# Patient Record
Sex: Male | Born: 1985 | Race: Black or African American | Hispanic: No | Marital: Single
Health system: Midwestern US, Community
[De-identification: ages and names within clinical notes are randomized; demographics above are authoritative.]

## PROBLEM LIST (undated history)

## (undated) DIAGNOSIS — I10 Essential (primary) hypertension: Secondary | ICD-10-CM

---

## 2008-10-20 ENCOUNTER — Observation Stay (HOSPITAL_COMMUNITY): Admission: EM | Admit: 2008-10-20 | Discharge: 2008-10-21 | Payer: Self-pay | Admitting: Emergency Medicine

## 2008-10-29 ENCOUNTER — Inpatient Hospital Stay (HOSPITAL_COMMUNITY): Admission: RE | Admit: 2008-10-29 | Discharge: 2008-10-31 | Payer: Self-pay | Admitting: Orthopedic Surgery

## 2009-06-24 ENCOUNTER — Ambulatory Visit (HOSPITAL_COMMUNITY): Admission: RE | Admit: 2009-06-24 | Discharge: 2009-06-25 | Payer: Self-pay | Admitting: Orthopedic Surgery

## 2010-10-11 LAB — CBC
Hemoglobin: 14.5 g/dL (ref 13.0–17.0)
MCHC: 34.3 g/dL (ref 30.0–36.0)
MCV: 91.5 fL (ref 78.0–100.0)
RBC: 4.61 MIL/uL (ref 4.22–5.81)

## 2010-10-19 LAB — BASIC METABOLIC PANEL
CO2: 27 mEq/L (ref 19–32)
Calcium: 10.1 mg/dL (ref 8.4–10.5)
Creatinine, Ser: 1.09 mg/dL (ref 0.4–1.5)
Glucose, Bld: 79 mg/dL (ref 70–99)

## 2010-10-19 LAB — CBC
MCHC: 34.4 g/dL (ref 30.0–36.0)
RDW: 13.8 % (ref 11.5–15.5)

## 2010-10-19 LAB — POCT I-STAT, CHEM 8
Glucose, Bld: 92 mg/dL (ref 70–99)
HCT: 48 % (ref 39.0–52.0)
Hemoglobin: 16.3 g/dL (ref 13.0–17.0)
Potassium: 3.4 mEq/L — ABNORMAL LOW (ref 3.5–5.1)
Sodium: 136 mEq/L (ref 135–145)

## 2010-11-22 NOTE — Consult Note (Signed)
NAMELENIS, NETTLETON NO.:  [id number]   MEDICAL RECORD NO.:  [id number]          PATIENT TYPE:  EMS   LOCATION:  ED                           FACILITY:  [facility]   PHYSICIAN:  Alvy Beal, MD    DATE OF BIRTH:  Mar 31, 1986   DATE OF CONSULTATION:  10/20/2008  DATE OF DISCHARGE:                                 CONSULTATION   REASON FOR CONSULTATION:  Bilateral upper and the elbow injury.   HISTORY:  This is a very pleasant 25 year old otherwise healthy African  American male who was playing basketball earlier today when he fell and  landed on both elbows.  He noted immediate pain and inability to move  the elbow.  He was brought by EMS to the emergency room here at [facility] where x-rays and initial evaluation was done.  X-  rays demonstrated a posterior right elbow dislocation and a left distal  supracondylar fracture as well as a questionable small avulsion-type  fracture from the posterior olecranon on the right side.  As a result of  the left supracondylar fracture and the right fracture dislocation of  the elbow, orthopedic consultation was requested.  Upon arrival the  patient was alert and oriented x3 complaining only of elbow pain.   PAST MEDICAL HISTORY:   PAST SURGICAL HISTORY:   FAMILY HISTORY:   SOCIAL HISTORY:  Otherwise unremarkable.  He has had a previous left  shoulder arthroscopy, but no other significant medical problems.   ALLERGIES:  No known drug allergies.   SOCIAL HISTORY:  He denies tobacco, illicit drug use and alcohol use.   CLINICAL EXAMINATION:  GENERAL:  He is a pleasant gentleman who appears  his stated age in no acute distress.  He is alert and oriented x3.  No  shortness of breath or chest pain at present.  ABDOMEN:  Soft and nontender.  EXTREMITIES:  Full range of motion in the lower extremities bilaterally  with no hip, knee or ankle pain.  Intact peripheral pulses at the radial  artery  bilaterally.  Posterior interosseous nerve, median nerve, ulnar  nerve function is intact bilaterally.  He has no significant shoulder  pain.  He has significant swelling and deformity of both elbows.   X-rays were reviewed and they do demonstrate a supracondylar fracture of  the left elbow.  It does not appear to involve the joint.  The right  elbow has a posterior dislocation of the ulna of the elbow.  There is a  small avulsion off the tip of the olecranon as well.   At this point in time after providing informed consent, the patient was  consciously sedated and under sterile condition, an intra-articular  lidocaine block was given into the right elbow.  This was 10 mL of plain  lidocaine.  A gentle reduction maneuver was performed and the elbow was  easily reduced.  It had excellent range of motion post reduction and  there was no gross instability that I could determine.  At this point  with the right elbow reduced, a posterior splint was applied  and then  two side struts were applied for augmenting of the string of the splint.  Once this elbow was taken care of on the left side, I gently flexed the  elbow up to about 90 degrees and placed a posterior splint with again  side struts.   Once both splints were applied the patient was still able to move all of  his fingers.  Median, ulnar and posterior interosseous nerve function  was again tested and was intact and the pulses were still strong.  Capillary refill was less than 2 seconds.  At this point in time the  patient will be admitted for observation and CT scanning of both elbows.  I did have the opportunity to discuss this case with my partner, Dr.  Melvyn Novas IV and he will follow up the patient after discharge for  definitive fracture management.  At this point in time, we will obtain  the CT scans to further delineate the extent of the fracture and both  elbows.  Post reduction x-rays were taken in the ER post splint   application.  They are still pending at this time.   The patient will be admitted with a PCA for pain control and Robaxin for  muscle spasms and observation and icing.  As long as he remains  neurologically intact, then we will discharge him tomorrow.      Alvy Beal, MD  Electronically Signed     DDB/MEDQ  D:  10/20/2008  T:  10/20/2008  Job:  [id number]   cc:   [facility]

## 2010-11-22 NOTE — Op Note (Signed)
NAME:  Jonathan [name], Jonathan [name] NO.:  [id number]   MEDICAL RECORD NO.:  [id number]          PATIENT TYPE:  OIB   LOCATION:  5005                         FACILITY:  [facility]   PHYSICIAN:  Madelynn Done, MD  DATE OF BIRTH:  1986-05-20   DATE OF PROCEDURE:  DATE OF DISCHARGE:                               OPERATIVE REPORT   PREOPERATIVE DIAGNOSIS:  Left elbow intraarticular distal humerus  fracture.   POSTOPERATIVE DIAGNOSIS:  Left elbow intraarticular distal humerus  fracture.   ATTENDING SURGEON:  Madelynn Done, MD, who scrubbed and present for  the entire procedure.   ASSISTANT SURGEON:  Alvy Beal, MD, who scrubbed and present for  the key portions of the procedure, who helped to assist in fracture  reduction and application of the plates and tension band fixation.   SURGICAL PROCEDURES:  1. Open treatment of left elbow distal humerus intraarticular fracture      with internal fixation.  2. Left elbow ulnar nerve decompression and anterior transposition.  3. Stress radiography, left elbow.   SURGICAL IMPLANTS:  Acumed medial and lateral and distal humerus plates,  six-hole plate on the lateral side, eight-hole plate on the medial side  with one standard Acutrak screw and one 4.5 mm screw outside of the  plates.   SURGICAL INDICATIONS:  Jonathan [name] is a 25 year old right-hand-dominant  gentleman who sustained a closed injury to both elbows sustaining a  comminuted intraarticular fracture to the left elbow as well as a closed  elbow dislocation on the contralateral side.  Due to the intraarticular  nature and displacement of the fracture, it was recommended that he  undergo open reduction and internal fixation of his left elbow.  Risks,  benefits, and alternatives were discussed in detail with the patient and  signed informed consent was obtained.  Risks include, but not limited to  bleeding; infection; damage to nearby nerves, arteries, or tendons;  nonunion; malunion; heterotopic bone ossification; loss of motion to the  elbow; and need for further surgical intervention.   DESCRIPTION OF PROCEDURE:  The patient was properly identified in the  preoperative holding area and marked with a permanent marker made on the  left elbow to indicate correct operative site.  The patient was then  brought back to the operating room, placed supine on the anesthesia room  table where general anesthesia was administered via LMA.  The patient  tolerated this well.  A well-padded tourniquet was then placed on the  left brachium and sealed with a 1000 drape.  The left upper extremity  was then prepped with Hibiclens and then sterilely draped.  Prior to the  prepping and draping, the patient was placed in the lateral decubitus  position and all pressure points were well padded.  Axillary roll was  used.  A Foley catheter was inserted prior to the beginning of the case  sterilely.  The patient tolerated this well.  All pressure points were  well padded.  After the prepping and the drape, attention was then  turned to the posterior aspect of the elbow.  A  longitudinal incision  was then made curving slightly the radial over the posterior surface of  the olecranon, after which a sterile tourniquet was then applied.  This  tourniquet was insufflated to 250 mmHg.  The skin flaps were raised both  radially and ulnarly.  Attention was then turned ulnarly.  Radioulnar  nerve was then dissected and identified both proximally and distally.  An ulnar nerve decompression was then carried out releasing all points  of compression.  A small vessel loop was applied around the nerve and  protected throughout the entire case.  After the ulnar nerve was then  identified, then an osteotomy was then created using a small oscillating  saw completed with an osteotome in the center of the olecranon sulcus.  This was exposed by elevating the anconeus and placing a small sponge   from lateral to medial to use its counter traction for the osteotomy.  After the osteotomy was then created, the olecranon fragment was then  elevated proximally leaving margins of the triceps tendon on either side  to sew with the completion of surgery.  The fracture anatomy was then  confirmed and the articular fragments were then reduced on the bony  columns of the distal humerus.  Fracture hematoma was then evacuated.  Following this, the joint was then thoroughly irrigated and fracture  reduction was then obtained.  A large reduction clamp was then used in  order to reduce the fracture fragments.  The articular fragments were  then reduced and then a K-wire was then placed from medial to lateral  and then using a parallel drill, the appropriate drill was then used for  the 4.5 mm screw going across the condyles with good compression across  the condyles.  Once the articular sterile structure was then reduced and  the condyles reduced, attention was then turned to definitive fracture  fixation along the medial and lateral columns.  The Acumed medial column  plate was then fashioned, an eight-hole plate was then fixed proximally  and site was then adjusted appropriately.  Following this, a screw  fixation was then begun with the appropriate drill and depth gauge  measurements with the cortices above the fracture site and 3 more distal  screws.   Following the medial column fixation, attention was then turned to the  posterior and lateral column.  The patient did have a large posterior  cortical fragment and this was reduced in place.  The fracture fragment  was gone in the coronal plane and therefore a screw was then placed from  the posterior-to-anterior standard Acutrak screw reducing this fracture  fragment.  The lateral column plate and six-hole plate was then  fashioned and then two screws were then placed distally and two screws  were then placed proximally.  There was not  significant comminution in  the lateral column for it was in continuity.  Following lateral column  fixation, the wounds were then thoroughly irrigated.  Final C-arm images  were then obtained.  There was good fixation of both planes of the  distal humerus fracture.  Following this, the elbow was placed through a  range of motion and there was good stability following fixation.  Attention was then turned to the olecranon osteotomy.  This was then  reduced under direct visualization.  Using the Acumed tension band, two  K-wires were then placed parallel down the shaft of the intramedullary  canal.  A small transverse drill hole was then made proximally and an 18-  gauge wire was then fashioned.  Through the eyelets of the K-wires, the  suture was then tied in a figure-of-eight fashion with two knots  tensioning the wires.  The eyelets were then tamped down to the bone and  then broken off creating the tension construct.  Following this, the  wounds were then thoroughly irrigated.  The elbow was then placed  through a stress radiography confirming placement and application of the  implants.  There was good maintenance of both the medial and lateral  columns with good fixation of both the osteotomy and the distal humerus  fracture.  Following this, the triceps fascia both medially and  laterally was then closed with 0 Vicryl suture, figure-of-eight sutures  that were reinforced.  Attention was then turned to the ulnar nerve  transposition.  There was good coverage of the plate medially and the  nerve was then transposed anteriorly in a small pocket.  Subcutaneous  pocket was then created in order to mobilize the nerve anteriorly to  prevent posterior subluxation and avoidance of the implants.  The ulnar  nerve was transposed without any significant compression.  Following  this, the wounds were then thoroughly irrigated.  The subcutaneous  tissues were then closed with 0 Vicryl.  The skin was  then closed with  skin staples.  The wound was then thoroughly cleansed with 20 mL of  0.25% Marcaine and infiltrated posteriorly avoiding the medial side.  Xeroform dressing was then applied.  The patient was then placed in a  long-arm posterior splint.  He was extubated and taken to recovery room  in good condition.   POSTOPERATIVE PLAN:  The patient will be admitted overnight for IV  antibiotics and pain control.  He will begin activities, active  inpatient assisted flexion and extension exercises for the first 3-4  weeks.  Radiographs with 10-day mark and 3-week mark and continue to  monitor his progress.  At 6 weeks postoperatively, if the radiographic  union look good, the patient may progress to light resisted activities.      Madelynn Done, MD  Electronically Signed     FWO/MEDQ  D:  10/28/2008  T:  10/29/2008  Job:  [id number]

## 2010-11-25 NOTE — Discharge Summary (Signed)
[name], [name] NO.:  [id number]   MEDICAL RECORD NO.:  [id number]          PATIENT TYPE:  INP   LOCATION:  5005                         FACILITY:  [facility]   PHYSICIAN:  Madelynn Done, MD  DATE OF BIRTH:  20-Dec-1985   DATE OF ADMISSION:  10/28/2008  DATE OF DISCHARGE:  10/31/2008                               DISCHARGE SUMMARY   ADMISSION DIAGNOSES:  1. Left elbow distal humerus fracture.  2. Right elbow dislocation.   DISCHARGE DIAGNOSES:  1. Left elbow distal humerus fracture.  2. Right elbow dislocation.   CONDITION AT DISCHARGE:  Good.   SURGICAL PROCEDURES:  Open reduction and internal fixation of left elbow  distal humerus fracture on October 28, 2008.   DISCHARGE MEDICATIONS:  1. Percocet 5/500 one to two tablets every 4-6 hours as needed for      pain.  2. Robaxin 500 mg p.o. q.6 h. p.r.n. spasm.  3. Colace 100 mg p.o. b.i.d.   REASON FOR ADMISSION:  [name] is a 25 year old gentleman who  sustained bilateral elbow injuries from a fall playing basketball.  The  patient was admitted to undergo the surgery on his left elbow.  The  patient was admitted postoperatively following the extent to surgery.   HOSPITAL COURSE:  The patient was admitted to the orthopedic floor  following surgery.  The patient tolerated general anesthesia.  He was  continued on IV pain medication and IV antibiotics.  Occupational  therapy was consulted postoperatively because the patient did have  limited use of both upper extremities with his right arm being in a cast  and his left arm having been operated arm.  He needed assistance with  all ADLs because he did not have both of his elbows to use.  Postoperatively, his hospital course was somewhat prolonged because of  pain control and activity modification and getting him back to his  functional activity level.  Home health and case management were  consulted for evaluation.  The patient was seen throughout his  hospital  course and he was felt to be ready to be discharged on postoperative day  #3.  On the day of discharge, he was afebrile.  Vital signs were stable  and normal.  He was tolerating a regular diet and felt to be ready for  discharge to home.   RECOMMENDATIONS AND DISPOSITION:  The patient will be discharged to  home.  We will see him back in the office in approximately 1 week for  radiographs and evaluation, and then begin an outpatient therapy  program.  We will continue with the cast on both upper extremities.  He is to keep  his bandages clean and dry.  No lifting or use with both hands.  Prior  to his discharge, the patient's discharge instructions were explained to  him.  The questions were answered and the patient voiced understanding  of discharge instructions.      Madelynn Done, MD  Electronically Signed     FWO/MEDQ  D:  11/05/2008  T:  11/05/2008  Job:  [id number]

## 2011-02-02 ENCOUNTER — Emergency Department (HOSPITAL_COMMUNITY)
Admission: EM | Admit: 2011-02-02 | Discharge: 2011-02-02 | Disposition: A | Discharge status: Home / Self Care | Payer: Self-pay | Attending: Emergency Medicine | Admitting: Emergency Medicine

## 2011-02-02 DIAGNOSIS — K089 Disorder of teeth and supporting structures, unspecified: Secondary | ICD-10-CM | POA: Insufficient documentation

## 2011-02-02 DIAGNOSIS — R51 Headache: Secondary | ICD-10-CM | POA: Insufficient documentation

## 2012-08-15 ENCOUNTER — Encounter (HOSPITAL_COMMUNITY): Payer: Self-pay | Admitting: *Deleted

## 2012-08-15 ENCOUNTER — Emergency Department (HOSPITAL_COMMUNITY): Payer: PRIVATE HEALTH INSURANCE

## 2012-08-15 ENCOUNTER — Emergency Department (HOSPITAL_COMMUNITY)
Admission: EM | Admit: 2012-08-15 | Discharge: 2012-08-16 | Disposition: A | Discharge status: Home / Self Care | Payer: PRIVATE HEALTH INSURANCE | Attending: Emergency Medicine | Admitting: Emergency Medicine

## 2012-08-15 DIAGNOSIS — S62609A Fracture of unspecified phalanx of unspecified finger, initial encounter for closed fracture: Secondary | ICD-10-CM | POA: Insufficient documentation

## 2012-08-15 DIAGNOSIS — F17200 Nicotine dependence, unspecified, uncomplicated: Secondary | ICD-10-CM | POA: Insufficient documentation

## 2012-08-15 DIAGNOSIS — I10 Essential (primary) hypertension: Secondary | ICD-10-CM | POA: Insufficient documentation

## 2012-08-15 DIAGNOSIS — IMO0001 Reserved for inherently not codable concepts without codable children: Secondary | ICD-10-CM

## 2012-08-15 DIAGNOSIS — Y929 Unspecified place or not applicable: Secondary | ICD-10-CM | POA: Insufficient documentation

## 2012-08-15 DIAGNOSIS — X58XXXA Exposure to other specified factors, initial encounter: Secondary | ICD-10-CM | POA: Insufficient documentation

## 2012-08-15 DIAGNOSIS — Y939 Activity, unspecified: Secondary | ICD-10-CM | POA: Insufficient documentation

## 2012-08-15 DIAGNOSIS — S61209A Unspecified open wound of unspecified finger without damage to nail, initial encounter: Secondary | ICD-10-CM | POA: Insufficient documentation

## 2012-08-15 HISTORY — DX: Essential (primary) hypertension: I10

## 2012-08-15 NOTE — ED Notes (Signed)
Injury is to left hand

## 2012-08-15 NOTE — ED Provider Notes (Signed)
History   This chart was scribed for non-physician practitioner working with Jones Skene, MD by Gerlean Ren, ED Scribe. This patient was seen in room WTR9/WTR9 and the patient's care was started at 10:58 PM.   CSN: [id number]  Arrival date & time 08/15/12  2211   First MD Initiated Contact with Patient 08/15/12 2258      No chief complaint on file.    The history is provided by the patient. No language interpreter was used.  Jonathan [name] is a 27 y.o. male who presents to the Emergency Department complaining of multiple painful lacerations on left hand after punching through a glass window.  Pt denies any further injuries as a result.  Pt is unsure of last tetanus shot.    Past Medical History  Diagnosis Date  . Hypertension     History reviewed. No pertinent past surgical history.  No family history on file.  History  Substance Use Topics  . Smoking status: Current Every Day Smoker    Types: Cigarettes  . Smokeless tobacco: Not on file  . Alcohol Use: Yes      Review of Systems A complete 10 system review of systems was obtained and all systems are negative except as noted in the HPI and PMH.   Allergies  Review of patient's allergies indicates no known allergies.  Home Medications   Current Outpatient Rx  Name  Route  Sig  Dispense  Refill  . ADULT MULTIVITAMIN W/MINERALS CH   Oral   Take 1 tablet by mouth daily.           BP 142/89  Pulse 89  Temp 99.1 F (37.3 C)  Resp 20  SpO2 98%  Physical Exam  Nursing note and vitals reviewed. Constitutional: He is oriented to person, place, and time. He appears well-developed and well-nourished. No distress.  HENT:  Head: Normocephalic and atraumatic.  Eyes: EOM are normal.  Neck: Neck supple. No tracheal deviation present.  Cardiovascular: Normal rate.   Pulmonary/Chest: Effort normal. No respiratory distress.  Musculoskeletal: Normal range of motion.       Superficial lacerations between all four  finger, small avulsion on ulnar side of the base of the thumb, swelling over knuckle of third digit, good cap refill  Neurological: He is alert and oriented to person, place, and time.  Skin: Skin is warm and dry.  Psychiatric: He has a normal mood and affect. His behavior is normal.    ED Course  Procedures (including critical care time) DIAGNOSTIC STUDIES: Oxygen Saturation is 98% on room air, normal by my interpretation.    COORDINATION OF CARE: 11:00 PM- Informed pt that I will clean to wound and apply a new wrap around the hand.  Informed pt of hand XR.  Pt understands and agrees with plan.   Dg Hand Complete Left  08/15/2012  *RADIOLOGY REPORT*  Clinical Data: Patient punched a window of the left hand today and now has lacerations and pain.  LEFT HAND - COMPLETE 3+ VIEW  Comparison: None.  Findings: There is volar angulation of the distal fifth metacarpal bone.  No discrete fracture line is identified in this may be due to old fracture deformity.  Occult fracture is not entirely excluded.  The soft tissue swelling over the metacarpal heads. There is a mildly displaced fracture of the base of the proximal phalanx of the first finger with extension to the joint surface. Prominent overlying soft tissue swelling.  No radiopaque soft tissue foreign bodies.  IMPRESSION: Mildly displaced gamekeepers type fracture of the left first finger.  Soft tissue swelling.  Volar angulation of the fifth metacarpal bone without obvious acute fracture may represent old fracture deformity.  Soft tissue swelling is present however.   Original Report Authenticated By: Burman Nieves, M.D.    The wound was an avulsion type injury and was bleeding. Applied Combat Gauze to the area. The patient is advised of the need to follow up with ortho. Told to return here as needed. Splint applied.   MDM  I personally performed the services described in this documentation, which was scribed in my presence. The recorded  information has been reviewed and is accurate.        Carlyle Dolly, PA-C 08/18/12 [phone]

## 2012-08-15 NOTE — ED Notes (Signed)
Pt states punched glass window; presents with swelling to knuckles and abrasion/cut to base of thumb; pressure dressing applied with bleeding controlled; small laceration noted between small finger and ring finger; etoh

## 2012-08-16 MED ORDER — HYDROCODONE-ACETAMINOPHEN 5-325 MG PO TABS: 1.0000 | ORAL_TABLET | Freq: Four times a day (QID) | ORAL | Status: DC | PRN

## 2012-08-16 MED ORDER — HYDROCODONE-ACETAMINOPHEN 5-325 MG PO TABS
1.0000 | ORAL_TABLET | Freq: Once | ORAL | Status: AC
  Administered 2012-08-16: 1 via ORAL
  Filled 2012-08-16: qty 1

## 2012-08-16 NOTE — Progress Notes (Signed)
Orthopedic Tech Progress Note Patient Details:  Jonathan [name] [birth year] [id number]  Ortho Devices Type of Ortho Device: Ulna gutter splint   [name] 08/16/2012, 12:43 AM

## 2012-08-16 NOTE — ED Notes (Signed)
Ortho tech paged for orders, will arrive from [facility] as soon as available

## 2012-08-24 NOTE — ED Provider Notes (Signed)
Medical screening examination/treatment/procedure(s) were performed by non-physician practitioner and as supervising physician I was immediately available for consultation/collaboration.  John-Adam Maudry Zeidan, M.D.     John-Adam Jaice Digioia, MD 08/24/12 0736 

## 2013-08-29 ENCOUNTER — Encounter (HOSPITAL_COMMUNITY): Payer: Self-pay | Admitting: Emergency Medicine

## 2013-08-29 ENCOUNTER — Emergency Department (HOSPITAL_COMMUNITY): Payer: BC Managed Care – PPO

## 2013-08-29 ENCOUNTER — Emergency Department (HOSPITAL_COMMUNITY)
Admission: EM | Admit: 2013-08-29 | Discharge: 2013-08-29 | Disposition: A | Discharge status: Home / Self Care | Payer: BC Managed Care – PPO | Attending: Emergency Medicine | Admitting: Emergency Medicine

## 2013-08-29 DIAGNOSIS — I10 Essential (primary) hypertension: Secondary | ICD-10-CM | POA: Insufficient documentation

## 2013-08-29 DIAGNOSIS — Z79899 Other long term (current) drug therapy: Secondary | ICD-10-CM | POA: Insufficient documentation

## 2013-08-29 DIAGNOSIS — F430 Acute stress reaction: Secondary | ICD-10-CM | POA: Insufficient documentation

## 2013-08-29 DIAGNOSIS — R1011 Right upper quadrant pain: Secondary | ICD-10-CM | POA: Insufficient documentation

## 2013-08-29 DIAGNOSIS — R0789 Other chest pain: Secondary | ICD-10-CM

## 2013-08-29 DIAGNOSIS — F17200 Nicotine dependence, unspecified, uncomplicated: Secondary | ICD-10-CM | POA: Insufficient documentation

## 2013-08-29 DIAGNOSIS — R071 Chest pain on breathing: Secondary | ICD-10-CM | POA: Insufficient documentation

## 2013-08-29 DIAGNOSIS — R1013 Epigastric pain: Secondary | ICD-10-CM | POA: Insufficient documentation

## 2013-08-29 LAB — COMPREHENSIVE METABOLIC PANEL
ALK PHOS: 61 U/L (ref 39–117)
ALT: 12 U/L (ref 0–53)
AST: 21 U/L (ref 0–37)
Albumin: 4.7 g/dL (ref 3.5–5.2)
BILIRUBIN TOTAL: 0.8 mg/dL (ref 0.3–1.2)
BUN: 15 mg/dL (ref 6–23)
CHLORIDE: 99 meq/L (ref 96–112)
CO2: 26 mEq/L (ref 19–32)
CREATININE: 1.22 mg/dL (ref 0.50–1.35)
Calcium: 9.8 mg/dL (ref 8.4–10.5)
GFR calc non Af Amer: 79 mL/min — ABNORMAL LOW (ref >90)
GLUCOSE: 85 mg/dL (ref 70–99)
POTASSIUM: 4.5 meq/L (ref 3.7–5.3)
Sodium: 140 mEq/L (ref 137–147)
Total Protein: 8 g/dL (ref 6.0–8.3)

## 2013-08-29 LAB — CBC
HEMATOCRIT: 47.9 % (ref 39.0–52.0)
HEMOGLOBIN: 16.6 g/dL (ref 13.0–17.0)
MCH: 31.3 pg (ref 26.0–34.0)
MCHC: 34.7 g/dL (ref 30.0–36.0)
MCV: 90.2 fL (ref 78.0–100.0)
Platelets: 232 10*3/uL (ref 150–400)
RBC: 5.31 MIL/uL (ref 4.22–5.81)
RDW: 13.7 % (ref 11.5–15.5)
WBC: 5.1 10*3/uL (ref 4.0–10.5)

## 2013-08-29 LAB — TROPONIN I

## 2013-08-29 LAB — LIPASE, BLOOD: LIPASE: 35 U/L (ref 11–59)

## 2013-08-29 MED ORDER — DICYCLOMINE HCL 20 MG PO TABS: 20.0000 mg | ORAL_TABLET | Freq: Two times a day (BID) | ORAL | Status: AC | PRN

## 2013-08-29 MED ORDER — OMEPRAZOLE 20 MG PO CPDR: 20.0000 mg | DELAYED_RELEASE_CAPSULE | Freq: Every day | ORAL | Status: AC

## 2013-08-29 MED ORDER — METHOCARBAMOL 500 MG PO TABS
500.0000 mg | ORAL_TABLET | Freq: Two times a day (BID) | ORAL | Status: AC
Start: 2013-08-29 — End: ?

## 2013-08-29 NOTE — ED Notes (Signed)
Pt returned from Radiology.

## 2013-08-29 NOTE — Discharge Instructions (Signed)
1. Medications: bentyl for abd pain, prilosec every day, robaxin for chest pain, usual home medications 2. Treatment: rest, drink plenty of fluids, tylenol for further pain control 3. Follow Up: Please followup with your primary doctor for discussion of your diagnoses and further evaluation after today's visit;  Chest Wall Pain Chest wall pain is pain in or around the bones and muscles of your chest. It may take up to 6 weeks to get better. It may take longer if you must stay physically active in your work and activities.  CAUSES  Chest wall pain may happen on its own. However, it may be caused by:  A viral illness like the flu.  Injury.  Coughing.  Exercise.  Arthritis.  Fibromyalgia.  Shingles. HOME CARE INSTRUCTIONS   Avoid overtiring physical activity. Try not to strain or perform activities that cause pain. This includes any activities using your chest or your abdominal and side muscles, especially if heavy weights are used.  Put ice on the sore area.  Put ice in a plastic bag.  Place a towel between your skin and the bag.  Leave the ice on for 15-20 minutes per hour while awake for the first 2 days.  Only take over-the-counter or prescription medicines for pain, discomfort, or fever as directed by your caregiver. SEEK IMMEDIATE MEDICAL CARE IF:   Your pain increases, or you are very uncomfortable.  You have a fever.  Your chest pain becomes worse.  You have new, unexplained symptoms.  You have nausea or vomiting.  You feel sweaty or lightheaded.  You have a cough with phlegm (sputum), or you cough up blood. MAKE SURE YOU:   Understand these instructions.  Will watch your condition.  Will get help right away if you are not doing well or get worse. Document Released: 06/26/2005 Document Revised: 09/18/2011 Document Reviewed: 02/20/2011 [facility] Patient Information 2014 [place], [place].  Peptic Ulcer A peptic ulcer is a sore in the lining of in your  esophagus (esophageal ulcer), stomach (gastric ulcer), or in the first part of your small intestine (duodenal ulcer). The ulcer causes erosion into the deeper tissue. CAUSES  Normally, the lining of the stomach and the small intestine protects itself from the acid that digests food. The protective lining can be damaged by:  An infection caused by a bacterium called Helicobacter pylori (H. pylori).  Regular use of nonsteroidal anti-inflammatory drugs (NSAIDs), such as ibuprofen or aspirin.  Smoking tobacco. Other risk factors include being older than 50, drinking alcohol excessively, and having a family history of ulcer disease.  SYMPTOMS   Burning pain or gnawing in the area between the chest and the belly button.  Heartburn.  Nausea and vomiting.  Bloating. The pain can be worse on an empty stomach and at night. If the ulcer results in bleeding, it can cause:  Black, tarry stools.  Vomiting of bright red blood.  Vomiting of coffee ground looking materials. DIAGNOSIS  A diagnosis is usually made based upon your history and an exam. Other tests and procedures may be performed to find the cause of the ulcer. Finding a cause will help determine the best treatment. Tests and procedures may include:  Blood tests, stool tests, or breath tests to check for the bacterium H. pylori.  An upper gastrointestinal (GI) series of the esophagus, stomach, and small intestine.  An endoscopy to examine the esophagus, stomach, and small intestine.  A biopsy. TREATMENT  Treatment may include:  Eliminating the cause of the ulcer, such  as smoking, NSAIDs, or alcohol.  Medicines to reduce the amount of acid in your digestive tract.  Antibiotic medicines if the ulcer is caused by the H. pylori bacterium.  An upper endoscopy to treat a bleeding ulcer.  Surgery if the bleeding is severe or if the ulcer created a hole somewhere in the digestive system. HOME CARE INSTRUCTIONS   Avoid tobacco,  alcohol, and caffeine. Smoking can increase the acid in the stomach, and continued smoking will impair the healing of ulcers.  Avoid foods and drinks that seem to cause discomfort or aggravate your ulcer.  Only take medicines as directed by your caregiver. Do not substitute over-the-counter medicines for prescription medicines without talking to your caregiver.  Keep any follow-up appointments and tests as directed. SEEK MEDICAL CARE IF:   Your do not improve within 7 days of starting treatment.  You have ongoing indigestion or heartburn. SEEK IMMEDIATE MEDICAL CARE IF:   You have sudden, sharp, or persistent abdominal pain.  You have bloody or dark black, tarry stools.  You vomit blood or vomit that looks like coffee grounds.  You become light headed, weak, or feel faint.  You become sweaty or clammy. MAKE SURE YOU:   Understand these instructions.  Will watch your condition.  Will get help right away if you are not doing well or get worse. Document Released: 06/23/2000 Document Revised: 03/20/2012 Document Reviewed: 01/24/2012 [facility] Patient Information 2014 [place].

## 2013-08-29 NOTE — ED Notes (Addendum)
Pt presents via [facility] from his PCP's office with c/o right-sided chest pain that shoots to left chest intermittently. Pain is worse with inspiration, palpation, and movement and makes pt feel like it is hard to take a deep breath. Pt was given 81mg  ASA and 1SL nitro at PCP office. Pt reports has been under extra stress lately regarding family issues and a joint custody "battle". PT reports occassional passive thoughts of SI with no plan and no intentions to act on these plans. Pt denies having active thoughts of SI at this time. Charge RN Italyhad made aware.

## 2013-08-29 NOTE — ED Provider Notes (Signed)
CSN: [id number]     Arrival date & time 08/29/13  1138 History   First MD Initiated Contact with Patient 08/29/13 1148     Chief Complaint  Patient presents with  . Chest Pain     (Consider location/radiation/quality/duration/timing/severity/associated sxs/prior Treatment) Patient is a 28 y.o. male presenting with chest pain. The history is provided by the patient and medical records. No language interpreter was used.  Chest Pain Associated symptoms: abdominal pain   Associated symptoms: no back pain, no cough, no diaphoresis, no fatigue, no fever, no headache, no nausea, no shortness of breath and not vomiting     Jonathan [name] is a 28 y.o. male  with a hx of HTN (diet controlled) presents to the Emergency Department complaining of gradual, persistent, progressively worsening sharp, right-sided chest pain which radiates to the left chest onset 8 PM last night.  Patient reports pain is worse with deep inspiration, movement and palpation. Reports that he previously has done exercises and weightlifting but not recently. He reports increased stress in his life with a custody battle over his daughter.  He denies diaphoresis, nausea or vomiting. He endorses approximately one month of epigastric and right upper quadrant abdominal pain present currently and occurring after eating.  He reports that he has taken Bentyl for this pain without significant relief.  He denies a cardiac history.  Pt denies fever, chills, headache, neck pain, shortness of breath, nausea, vomiting, diarrhea, weakness, dizziness, syncope.  Patient relates to the RN at triage that he has had fleeting suicidal ideations without a plan.  He reports the use coming concurrence with increases in his stress.  He currently denies feeling this way and reports that he has never had a plan.     Past Medical History  Diagnosis Date  . Hypertension    History reviewed. No pertinent past surgical history. History reviewed. No pertinent  family history. History  Substance Use Topics  . Smoking status: Current Every Day Smoker    Types: Cigarettes  . Smokeless tobacco: Not on file  . Alcohol Use: Yes    Review of Systems  Constitutional: Negative for fever, diaphoresis, appetite change, fatigue and unexpected weight change.  HENT: Negative for mouth sores.   Eyes: Negative for visual disturbance.  Respiratory: Negative for cough, chest tightness, shortness of breath and wheezing.   Cardiovascular: Positive for chest pain.  Gastrointestinal: Positive for abdominal pain. Negative for nausea, vomiting, diarrhea and constipation.  Endocrine: Negative for polydipsia, polyphagia and polyuria.  Genitourinary: Negative for dysuria, urgency, frequency and hematuria.  Musculoskeletal: Negative for back pain and neck stiffness.  Skin: Negative for rash.  Allergic/Immunologic: Negative for immunocompromised state.  Neurological: Negative for syncope, light-headedness and headaches.  Hematological: Does not bruise/bleed easily.  Psychiatric/Behavioral: Negative for sleep disturbance. The patient is not nervous/anxious.       Allergies  Review of patient's allergies indicates no known allergies.  Home Medications   Current Outpatient Rx  Name  Route  Sig  Dispense  Refill  . amphetamine-dextroamphetamine (ADDERALL) 20 MG tablet   Oral   Take 20 mg by mouth daily.         Marland Kitchen dicyclomine (BENTYL) 20 MG tablet   Oral   Take 20 mg by mouth every 6 (six) hours.         . naproxen sodium (ANAPROX) 220 MG tablet   Oral   Take 440 mg by mouth every 6 (six) hours as needed (pain).         Marland Kitchen  dicyclomine (BENTYL) 20 MG tablet   Oral   Take 1 tablet (20 mg total) by mouth 2 (two) times daily as needed. abd pain   20 tablet   0   . methocarbamol (ROBAXIN) 500 MG tablet   Oral   Take 1 tablet (500 mg total) by mouth 2 (two) times daily.   20 tablet   0   . omeprazole (PRILOSEC) 20 MG capsule   Oral   Take 1  capsule (20 mg total) by mouth daily.   30 capsule   2    BP 107/78  Pulse 57  Temp(Src) 98.2 F (36.8 C) (Oral)  Resp 18  SpO2 100% Physical Exam  Nursing note and vitals reviewed. Constitutional: He appears well-developed and well-nourished. No distress.  Awake, alert, nontoxic appearance  HENT:  Head: Normocephalic and atraumatic.  Mouth/Throat: Oropharynx is clear and moist. No oropharyngeal exudate.  Eyes: Conjunctivae are normal. No scleral icterus.  Neck: Normal range of motion. Neck supple.  Cardiovascular: Normal rate, regular rhythm, normal heart sounds and intact distal pulses.   No murmur heard. Pulmonary/Chest: Effort normal and breath sounds normal. No accessory muscle usage. Not tachypneic. No respiratory distress. He has no decreased breath sounds. He has no wheezes. He has no rhonchi. He has no rales. He exhibits tenderness. He exhibits no bony tenderness.  Clear and equal breath sounds Tenderness to palpation of the right chest which creates left-sided chest pain; no tenderness to palpation of the left chest No crepitus, flail segment or deformity No ecchymosis of the chest  Abdominal: Soft. Normal appearance and bowel sounds are normal. He exhibits no distension and no mass. There is tenderness in the right upper quadrant and epigastric area. There is guarding and positive Murphy's sign. There is no rebound, no CVA tenderness and no tenderness at McBurney's point.  Patient with right upper quadrant and epigastric abdominal tenderness; positive Murphy sign No CVA tenderness No distention, no rebound, no peritoneal signs  Musculoskeletal: Normal range of motion. He exhibits no edema.  Neurological: He is alert.  Speech is clear and goal oriented Moves extremities without ataxia  Skin: Skin is warm and dry. He is not diaphoretic.  Psychiatric: He has a normal mood and affect.    ED Course  Procedures (including critical care time) Labs Review Labs Reviewed   COMPREHENSIVE METABOLIC PANEL - Abnormal; Notable for the following:    GFR calc non Af Amer 79 (*)    All other components within normal limits  LIPASE, BLOOD  CBC  TROPONIN I   Imaging Review Dg Chest 2 View  08/29/2013   CLINICAL DATA:  Chest pain, shortness of Breath  EXAM: CHEST  2 VIEW  COMPARISON:  None.  FINDINGS: Cardiomediastinal silhouette is unremarkable. No acute infiltrate or pleural effusion. No pulmonary edema. Bony thorax is unremarkable.  IMPRESSION: No active cardiopulmonary disease.   Electronically Signed   By: Natasha MeadLiviu  Pop M.D.   On: 08/29/2013 13:12   [place] Abdomen Complete  08/29/2013   CLINICAL DATA:  Right upper quadrant abdominal pain  EXAM: ULTRASOUND ABDOMEN COMPLETE  COMPARISON:  None.  FINDINGS: Gallbladder:  The gallbladder is visualized and no gallstones are noted. There is no pain over the gallbladder with compression.  Common bile duct:  Diameter: The common bile duct is normal measuring 3.1 mm in diameter.  Liver:  The liver has a normal echogenic pattern. No focal abnormality is seen.  IVC:  No abnormality visualized.  Pancreas:  Visualized portion unremarkable.  Spleen:  The spleen is normal measuring 6.3 cm sagittally.  Right Kidney:  Length: 10.6 cm.  No hydronephrosis is seen.  Left Kidney:  Length: 10.1 cm.  No hydronephrosis is noted.  Abdominal aorta:  The abdominal aorta is normal caliber.  Other findings:  None.  IMPRESSION: Negative abdominal ultrasound.   Electronically Signed   By: Dwyane Dee M.D.   On: 08/29/2013 14:03    EKG Interpretation    Date/Time:  Friday August 29 2013 11:44:25 EST Ventricular Rate:  53 PR Interval:  146 QRS Duration: 74 QT Interval:  420 QTC Calculation: 394 R Axis:   71 Text Interpretation:  Sinus bradycardia No significant change since last tracing Confirmed by STEINL  MD, KEVIN ([id number]) on 08/29/2013 12:09:03 PM            MDM   Final diagnoses:  Chest wall pain  Epigastric abdominal pain    [facility] presents with right-sided chest pain worse with movement and palpation. Patient with a history of hypertension but no other cardiac risk factors.  She also with epigastric pain and right upper quadrant abdominal pain present for one month.  Patient alert, oriented nontoxic and nonseptic appearing.  ECG with sinus bradycardia but otherwise without ischemia. No old for comparison.     Patient with reassuring labs and no abnormalities. Abdominal ultrasound with no evidence of cholecystitis or choledocholithiasis. Patient and admitting to heavy lifting while at work which is likely the cause of his chest wall pain.  She will be discharged home with muscle relaxers, PPI. He reports that the Bentyl is helping his abdominal pain. Will refill this prescription. Patient will followup with primary care early next week for further evaluation.  Chest pain is not likely of cardiac or pulmonary etiology d/t presentation, perc negative, VSS, no tracheal deviation, no JVD or new murmur, RRR, breath sounds equal bilaterally, EKG without acute abnormalities, negative troponin, and negative CXR. Pt has been advised start a PPI and return to the ED if CP becomes exertional, associated with diaphoresis or nausea, radiates to left jaw/arm, worsens or becomes concerning in any way. Pt appears reliable for follow up and is agreeable to discharge.   It has been determined that no acute conditions requiring further emergency intervention are present at this time. The patient/guardian have been advised of the diagnosis and plan. We have discussed signs and symptoms that warrant return to the ED, such as changes or worsening in symptoms.   Vital signs are stable at discharge.   BP 107/78  Pulse 57  Temp(Src) 98.2 F (36.8 C) (Oral)  Resp 18  SpO2 100%  Patient/guardian has voiced understanding and agreed to follow-up with the PCP or specialist.      Dierdre Forth, PA-C 08/29/13 1805

## 2013-08-29 NOTE — ED Notes (Signed)
Pt comfortable with d/c and f/u instructions. Prescriptions x3 

## 2013-08-30 NOTE — ED Provider Notes (Signed)
Medical screening examination/treatment/procedure(s) were conducted as a shared visit with non-physician practitioner(s) and myself.  I personally evaluated the patient during the encounter.  EKG Interpretation    Date/Time:  Friday August 29 2013 11:44:25 EST Ventricular Rate:  53 PR Interval:  146 QRS Duration: 74 QT Interval:  420 QTC Calculation: 394 R Axis:   71 Text Interpretation:  Sinus bradycardia No significant change since last tracing Confirmed by Zanyah Lentsch  MD, Donaldson Richter ([id number]) on 08/29/2013 12:09:03 PM            Pt c/o mid cp, sharp. Worse w positional changes, turning torso, palp chest. occ non prod cough. Chest cta. +chest wall tenderness reproducing symptoms. Cxr.     Suzi RootsKevin E Normagene Harvie, MD 08/30/13 [phone]

## 2015-01-30 ENCOUNTER — Emergency Department (HOSPITAL_COMMUNITY)
Admission: EM | Admit: 2015-01-30 | Discharge: 2015-01-30 | Disposition: A | Discharge status: Home / Self Care | Payer: PRIVATE HEALTH INSURANCE

## 2015-01-30 ENCOUNTER — Emergency Department (HOSPITAL_COMMUNITY)
Admission: EM | Admit: 2015-01-30 | Discharge: 2015-01-31 | Disposition: A | Discharge status: Home / Self Care | Payer: BLUE CROSS/BLUE SHIELD | Attending: Emergency Medicine | Admitting: Emergency Medicine

## 2015-01-30 DIAGNOSIS — W51XXXA Accidental striking against or bumped into by another person, initial encounter: Secondary | ICD-10-CM | POA: Insufficient documentation

## 2015-01-30 DIAGNOSIS — Y999 Unspecified external cause status: Secondary | ICD-10-CM | POA: Insufficient documentation

## 2015-01-30 DIAGNOSIS — S62395A Other fracture of fourth metacarpal bone, left hand, initial encounter for closed fracture: Secondary | ICD-10-CM | POA: Insufficient documentation

## 2015-01-30 DIAGNOSIS — Y9361 Activity, american tackle football: Secondary | ICD-10-CM | POA: Diagnosis not present

## 2015-01-30 DIAGNOSIS — I10 Essential (primary) hypertension: Secondary | ICD-10-CM | POA: Diagnosis not present

## 2015-01-30 DIAGNOSIS — S6292XA Unspecified fracture of left wrist and hand, initial encounter for closed fracture: Secondary | ICD-10-CM

## 2015-01-30 DIAGNOSIS — S62393A Other fracture of third metacarpal bone, left hand, initial encounter for closed fracture: Secondary | ICD-10-CM | POA: Insufficient documentation

## 2015-01-30 DIAGNOSIS — S6992XA Unspecified injury of left wrist, hand and finger(s), initial encounter: Secondary | ICD-10-CM | POA: Diagnosis present

## 2015-01-30 DIAGNOSIS — Z79899 Other long term (current) drug therapy: Secondary | ICD-10-CM | POA: Insufficient documentation

## 2015-01-30 DIAGNOSIS — Y92321 Football field as the place of occurrence of the external cause: Secondary | ICD-10-CM | POA: Diagnosis not present

## 2015-01-30 DIAGNOSIS — Z720 Tobacco use: Secondary | ICD-10-CM | POA: Diagnosis not present

## 2015-01-30 NOTE — ED Notes (Signed)
Pt did not wait to be triaged left  At [facility] ed now

## 2015-01-31 ENCOUNTER — Emergency Department (HOSPITAL_COMMUNITY): Payer: BLUE CROSS/BLUE SHIELD

## 2015-01-31 ENCOUNTER — Encounter (HOSPITAL_COMMUNITY): Payer: Self-pay | Admitting: Emergency Medicine

## 2015-01-31 MED ORDER — OXYCODONE-ACETAMINOPHEN 5-325 MG PO TABS
1.0000 | ORAL_TABLET | Freq: Once | ORAL | Status: AC
  Administered 2015-01-31: 1 via ORAL
  Filled 2015-01-31: qty 1

## 2015-01-31 MED ORDER — OXYCODONE-ACETAMINOPHEN 5-325 MG PO TABS: 1.0000 | ORAL_TABLET | ORAL | Status: AC | PRN

## 2015-01-31 NOTE — ED Provider Notes (Signed)
CSN: [id number]     Arrival date & time 01/30/15  2352 History   First MD Initiated Contact with Patient 01/31/15 [phone]     Chief Complaint  Patient presents with  . Hand Injury     (Consider location/radiation/quality/duration/timing/severity/associated sxs/prior Treatment) HPI Comments: Left hand pain after playing flag football last night and colliding with another player. No other injury. He complains of persistent pain with progressive swelling near the wrist. No numbness or tingling.   Patient is a 29 y.o. male presenting with hand injury. The history is provided by the patient. No language interpreter was used.  Hand Injury Location:  Hand Injury: yes   Hand location:  L hand Pain details:    Severity:  Moderate Associated symptoms: no fever     Past Medical History  Diagnosis Date  . Hypertension    History reviewed. No pertinent past surgical history. No family history on file. History  Substance Use Topics  . Smoking status: Current Every Day Smoker    Types: Cigarettes  . Smokeless tobacco: Not on file  . Alcohol Use: Yes    Review of Systems  Constitutional: Negative for fever and chills.  Musculoskeletal:       See HPI  Skin: Negative.  Negative for wound.  Neurological: Negative.  Negative for numbness.      Allergies  Review of patient's allergies indicates no known allergies.  Home Medications   Prior to Admission medications   Medication Sig Start Date End Date Taking? Authorizing Provider  amphetamine-dextroamphetamine (ADDERALL) 20 MG tablet Take 20 mg by mouth daily.   Yes Historical Provider, MD  dicyclomine (BENTYL) 20 MG tablet Take 20 mg by mouth daily.    Yes Historical Provider, MD  dicyclomine (BENTYL) 20 MG tablet Take 1 tablet (20 mg total) by mouth 2 (two) times daily as needed. abd pain Patient not taking: Reported on 01/31/2015 08/29/13   Dahlia Client Muthersbaugh, PA-C  methocarbamol (ROBAXIN) 500 MG tablet Take 1 tablet (500 mg total)  by mouth 2 (two) times daily. Patient not taking: Reported on 01/31/2015 08/29/13   Dahlia Client Muthersbaugh, PA-C  naproxen sodium (ANAPROX) 220 MG tablet Take 440 mg by mouth every 6 (six) hours as needed (pain).    Historical Provider, MD  omeprazole (PRILOSEC) 20 MG capsule Take 1 capsule (20 mg total) by mouth daily. Patient not taking: Reported on 01/31/2015 08/29/13   Dahlia Client Muthersbaugh, PA-C   BP 117/95 mmHg  Pulse 99  Temp(Src) 98.1 F (36.7 C) (Oral)  Resp 18  SpO2 99% Physical Exam  Constitutional: He is oriented to person, place, and time. He appears well-developed and well-nourished.  Neck: Normal range of motion.  Cardiovascular: Intact distal pulses.   Pulmonary/Chest: Effort normal.  Musculoskeletal: Normal range of motion.  Left hand swollen dorsally at mid-base of hand. Tender. FROM all digits without strength deficits. Wrist non-tender. No bony deformity.  Neurological: He is alert and oriented to person, place, and time.  Skin: Skin is warm and dry.  Psychiatric: He has a normal mood and affect.    ED Course  Procedures (including critical care time) Labs Review Labs Reviewed - No data to display  Imaging Review Dg Hand Complete Left  01/31/2015   CLINICAL DATA:  29 year old male with left hand injury  EXAM: LEFT HAND - COMPLETE 3+ VIEW  COMPARISON:  Radiograph dated 08/15/2012  FINDINGS: There is comminuted appearing oblique fracture of the fourth metacarpal with approximately 2 mm displacement. There is a nondisplaced oblique  fracture of the third metacarpal. The remainder of the visualized osseous structures appear unremarkable. There is soft tissue swelling of the hand. No radiopaque foreign object. Fractures of the  IMPRESSION: Fractures of the Third and fourth metacarpals.   Electronically Signed   By: Elgie Collard M.D.   On: 01/31/2015 01:14     EKG Interpretation None      MDM   Final diagnoses:  None    1. Hand fracture  Patient placed in a  splint, given care instructions. He has been seen by Dr. Janee Morn in the past for hand care and will refer back to Dr. Janee Morn next week.     Elpidio Anis, PA-C 01/31/15 1610  April Palumbo, MD 01/31/15 [phone]

## 2015-01-31 NOTE — ED Notes (Signed)
Pt c/o left hand injury while playing football. Pt states "I broke my hand". He is able to wiggle fingers, good radial pulse present and no obvious deformity noted.

## 2015-01-31 NOTE — Discharge Instructions (Signed)
Cast or Splint Care °Casts and splints support injured limbs and keep bones from moving while they heal. It is important to care for your cast or splint at home.   °HOME CARE INSTRUCTIONS °· Keep the cast or splint uncovered during the drying period. It can take 24 to 48 hours to dry if it is made of plaster. A fiberglass cast will dry in less than 1 hour. °· Do not rest the cast on anything harder than a pillow for the first 24 hours. °· Do not put weight on your injured limb or apply pressure to the cast until your health care provider gives you permission. °· Keep the cast or splint dry. Wet casts or splints can lose their shape and may not support the limb as well. A wet cast that has lost its shape can also create harmful pressure on your skin when it dries. Also, wet skin can become infected. °¨ Cover the cast or splint with a plastic bag when bathing or when out in the rain or snow. If the cast is on the trunk of the body, take sponge baths until the cast is removed. °¨ If your cast does become wet, dry it with a towel or a blow dryer on the cool setting only. °· Keep your cast or splint clean. Soiled casts may be wiped with a moistened cloth. °· Do not place any hard or soft foreign objects under your cast or splint, such as cotton, toilet paper, lotion, or powder. °· Do not try to scratch the skin under the cast with any object. The object could get stuck inside the cast. Also, scratching could lead to an infection. If itching is a problem, use a blow dryer on a cool setting to relieve discomfort. °· Do not trim or cut your cast or remove padding from inside of it. °· Exercise all joints next to the injury that are not immobilized by the cast or splint. For example, if you have a long leg cast, exercise the hip joint and toes. If you have an arm cast or splint, exercise the shoulder, elbow, thumb, and fingers. °· Elevate your injured arm or leg on 1 or 2 pillows for the first 1 to 3 days to decrease  swelling and pain. It is best if you can comfortably elevate your cast so it is higher than your heart. °SEEK MEDICAL CARE IF:  °· Your cast or splint cracks. °· Your cast or splint is too tight or too loose. °· You have unbearable itching inside the cast. °· Your cast becomes wet or develops a soft spot or area. °· You have a bad smell coming from inside your cast. °· You get an object stuck under your cast. °· Your skin around the cast becomes red or raw. °· You have new pain or worsening pain after the cast has been applied. °SEEK IMMEDIATE MEDICAL CARE IF:  °· You have fluid leaking through the cast. °· You are unable to move your fingers or toes. °· You have discolored (blue or white), cool, painful, or very swollen fingers or toes beyond the cast. °· You have tingling or numbness around the injured area. °· You have severe pain or pressure under the cast. °· You have any difficulty with your breathing or have shortness of breath. °· You have chest pain. °Document Released: 06/23/2000 Document Revised: 04/16/2013 Document Reviewed: 01/02/2013 °[employer]® Patient Information ©2015 [employer]. This information is not intended to replace advice given to you by your health care   provider. Make sure you discuss any questions you have with your health care provider. °Cryotherapy °Cryotherapy means treatment with cold. Ice or gel packs can be used to reduce both pain and swelling. Ice is the most helpful within the first 24 to 48 hours after an injury or flare-up from overusing a muscle or joint. Sprains, strains, spasms, burning pain, shooting pain, and aches can all be eased with ice. Ice can also be used when recovering from surgery. Ice is effective, has very few side effects, and is safe for most people to use. °PRECAUTIONS  °Ice is not a safe treatment option for people with: °· Raynaud phenomenon. This is a condition affecting small blood vessels in the extremities. Exposure to cold may cause your problems to  return. °· Cold hypersensitivity. There are many forms of cold hypersensitivity, including: °¨ Cold urticaria. Red, itchy hives appear on the skin when the tissues begin to warm after being iced. °¨ Cold erythema. This is a red, itchy rash caused by exposure to cold. °¨ Cold hemoglobinuria. Red blood cells break down when the tissues begin to warm after being iced. The hemoglobin that carry oxygen are passed into the urine because they cannot combine with blood proteins fast enough. °· Numbness or altered sensitivity in the area being iced. °If you have any of the following conditions, do not use ice until you have discussed cryotherapy with your caregiver: °· Heart conditions, such as arrhythmia, angina, or chronic heart disease. °· High blood pressure. °· Healing wounds or open skin in the area being iced. °· Current infections. °· Rheumatoid arthritis. °· Poor circulation. °· Diabetes. °Ice slows the blood flow in the region it is applied. This is beneficial when trying to stop inflamed tissues from spreading irritating chemicals to surrounding tissues. However, if you expose your skin to cold temperatures for too long or without the proper protection, you can damage your skin or nerves. Watch for signs of skin damage due to cold. °HOME CARE INSTRUCTIONS °Follow these tips to use ice and cold packs safely. °· Place a dry or damp towel between the ice and skin. A damp towel will cool the skin more quickly, so you may need to shorten the time that the ice is used. °· For a more rapid response, add gentle compression to the ice. °· Ice for no more than 10 to 20 minutes at a time. The bonier the area you are icing, the less time it will take to get the benefits of ice. °· Check your skin after 5 minutes to make sure there are no signs of a poor response to cold or skin damage. °· Rest 20 minutes or more between uses. °· Once your skin is numb, you can end your treatment. You can test numbness by very lightly touching  your skin. The touch should be so light that you do not see the skin dimple from the pressure of your fingertip. When using ice, most people will feel these normal sensations in this order: cold, burning, aching, and numbness. °· Do not use ice on someone who cannot communicate their responses to pain, such as small children or people with dementia. °HOW TO MAKE AN ICE PACK °Ice packs are the most common way to use ice therapy. Other methods include ice massage, ice baths, and cryosprays. Muscle creams that cause a cold, tingly feeling do not offer the same benefits that ice offers and should not be used as a substitute unless recommended by your caregiver. °To   make an ice pack, do one of the following: °· Place crushed ice or a bag of frozen vegetables in a sealable plastic bag. Squeeze out the excess air. Place this bag inside another plastic bag. Slide the bag into a pillowcase or place a damp towel between your skin and the bag. °· Mix 3 parts water with 1 part rubbing alcohol. Freeze the mixture in a sealable plastic bag. When you remove the mixture from the freezer, it will be slushy. Squeeze out the excess air. Place this bag inside another plastic bag. Slide the bag into a pillowcase or place a damp towel between your skin and the bag. °SEEK MEDICAL CARE IF: °· You develop white spots on your skin. This may give the skin a blotchy (mottled) appearance. °· Your skin turns blue or pale. °· Your skin becomes waxy or hard. °· Your swelling gets worse. °MAKE SURE YOU:  °· Understand these instructions. °· Will watch your condition. °· Will get help right away if you are not doing well or get worse. °Document Released: 02/20/2011 Document Revised: 11/10/2013 Document Reviewed: 02/20/2011 °ExitCare® Patient Information ©2015 [employer]. This information is not intended to replace advice given to you by your health care provider. Make sure you discuss any questions you have with your health care provider. °Hand  Fracture, Metacarpals °Fractures of metacarpals are breaks in the bones of the hand. They extend from the knuckles to the wrist. These bones can undergo many types of fractures. There are different ways of treating these fractures, all of which may be correct. °TREATMENT  °Hand fractures can be treated with:  °· Non-reduction - The fracture is casted without changing the positions of the fracture (bone pieces) involved. This fracture is usually left in a cast for 4 to 6 weeks or as your caregiver thinks necessary. °· Closed reduction - The bones are moved back into position without surgery and then casted. °· ORIF (open reduction and internal fixation) - The fracture site is opened and the bone pieces are fixed into place with some type of hardware, such as screws, etc. They are then casted. °Your caregiver will discuss the type of fracture you have and the treatment that should be best for that problem. If surgery is chosen, let your caregivers know about the following.  °LET YOUR CAREGIVERS KNOW ABOUT: °· Allergies. °· Medications you are taking, including herbs, eye drops, over the counter medications, and creams. °· Use of steroids (by mouth or creams). °· Previous problems with anesthetics or novocaine. °· Possibility of pregnancy. °· History of blood clots (thrombophlebitis). °· History of bleeding or blood problems. °· Previous surgeries. °· Other health problems. °AFTER THE PROCEDURE °After surgery, you will be taken to the recovery area where a nurse will watch and check your progress. Once you are awake, stable, and taking fluids well, barring other problems, you'll be allowed to go home. Once home, an ice pack applied to your operative site may help with pain and keep the swelling down. °HOME CARE INSTRUCTIONS  °· Follow your caregiver's instructions as to activities, exercises, physical therapy, and driving a car. °· Daily exercise is helpful for keeping range of motion and strength. Exercise as  instructed. °· To lessen swelling, keep the injured hand elevated above the level of your heart as much as possible. °· Apply ice to the injury for 15-20 minutes each hour while awake for the first 2 days. Put the ice in a plastic bag and place a thin towel   between the bag of ice and your cast. °· Move the fingers of your casted hand several times a day. °· If a plaster or fiberglass cast was applied: °· Do not try to scratch the skin under the cast using a sharp or pointed object. °· Check the skin around the cast every day. You may put lotion on red or sore areas. °· Keep your cast dry. Your cast can be protected during bathing with a plastic bag. Do not put your cast into the water. °· If a plaster splint was applied: °· Wear your splint for as long as directed by your caregiver or until seen again. °· Do not get your splint wet. Protect it during bathing with a plastic bag. °· You may loosen the elastic bandage around the splint if your fingers start to get numb, tingle, get cold or turn blue. °· Do not put pressure on your cast or splint; this may cause it to break. Especially, do not lean plaster casts on hard surfaces for 24 hours after application. °· Take medications as directed by your caregiver. °· Only take over-the-counter or prescription medicines for pain, discomfort, or fever as directed by your caregiver. °· Follow-up as provided by your caregiver. This is very important in order to avoid permanent injury or disability and chronic pain. °SEEK MEDICAL CARE IF:  °· Increased bleeding (more than a small spot) from beneath your cast or splint if there is beneath the cast as with an open reduction. °· Redness, swelling, or increasing pain in the wound or from beneath your cast or splint. °· Pus coming from wound or from beneath your cast or splint. °· An unexplained oral temperature above 102° F (38.9° C) develops, or as your caregiver suggests. °· A foul smell coming from the wound or dressing or from  beneath your cast or splint. °· You have a problem moving any of your fingers. °SEEK IMMEDIATE MEDICAL CARE IF:  °· You develop a rash °· You have difficulty breathing °· You have any allergy problems °If you do not have a window in your cast for observing the wound, a discharge or minor bleeding may show up as a stain on the outside of your cast. Report these findings to your caregiver. °MAKE SURE YOU:  °· Understand these instructions. °· Will watch your condition. °· Will get help right away if you are not doing well or get worse. °Document Released: 06/26/2005 Document Revised: 09/18/2011 Document Reviewed: 02/13/2008 °Exit[employer]® Patient Information ©2015 [employer]. This information is not intended to replace advice given to you by your health care provider. Make sure you discuss any questions you have with your health care provider. ° °

## 2016-11-15 ENCOUNTER — Inpatient Hospital Stay
Admit: 2016-11-15 | Discharge: 2016-11-15 | Disposition: A | Discharge status: Home / Self Care | Payer: BLUE CROSS/BLUE SHIELD | Attending: Emergency Medicine

## 2016-11-15 DIAGNOSIS — Z202 Contact with and (suspected) exposure to infections with a predominantly sexual mode of transmission: Secondary | ICD-10-CM

## 2016-11-15 MED ORDER — CEFTRIAXONE 250 MG SOLUTION FOR INJECTION
250 mg | Freq: Once | INTRAMUSCULAR | Status: AC
Start: 2016-11-15 — End: 2016-11-15
  Administered 2016-11-15: 21:00:00 via INTRAMUSCULAR

## 2016-11-15 MED ORDER — AZITHROMYCIN 250 MG TAB
250 mg | ORAL | Status: AC
Start: 2016-11-15 — End: 2016-11-15
  Administered 2016-11-15: 21:00:00 via ORAL

## 2016-11-15 MED ORDER — METRONIDAZOLE 500 MG TAB
500 mg | ORAL | Status: AC
Start: 2016-11-15 — End: 2016-11-15
  Administered 2016-11-15: 21:00:00 via ORAL

## 2016-11-15 MED FILL — AZITHROMYCIN 250 MG TAB: 250 mg | ORAL | Qty: 4

## 2016-11-15 MED FILL — CEFTRIAXONE 250 MG SOLUTION FOR INJECTION: 250 mg | INTRAMUSCULAR | Qty: 250

## 2016-11-15 MED FILL — METRONIDAZOLE 500 MG TAB: 500 mg | ORAL | Qty: 4

## 2016-11-15 NOTE — ED Triage Notes (Signed)
Pt states he received a call from his partner due to exposure to unknown STD.

## 2016-11-15 NOTE — ED Provider Notes (Signed)
HPI Comments: 31 year old African-American male presents stating that he is here for STD treatment as his girlfriend was seen here earlier today and treated for a positive Trichomonas test.  He is requesting to be treated for Trichomonas as well.  He is also requesting empiric treatment for gonorrhea and chlamydia. Patient denies having any symptoms.  He denies penile discharge or pain.  He denies any pain with ejaculation.  Patient denies any urinary urgency or frequency.    Patient is a 31 y.o. male presenting with STD exposure. The history is provided by the patient.   Exposure to STD   Incident onset: Unknown. The sexual encounter was with a regular sexual partner. Pertinent negatives include no nausea, no vomiting, no abdominal pain, no penile pain and no penile discharge. There is a concern regarding sexually transmitted diseases.        Past Medical History:   Diagnosis Date   ??? ADD (attention deficit disorder)        Past Surgical History:   Procedure Laterality Date   ??? HX ORTHOPAEDIC           History reviewed. No pertinent family history.    Social History     Social History   ??? Marital status: SINGLE     Spouse name: N/A   ??? Number of children: N/A   ??? Years of education: N/A     Occupational History   ??? Not on file.     Social History Main Topics   ??? Smoking status: Not on file   ??? Smokeless tobacco: Not on file   ??? Alcohol use Not on file   ??? Drug use: Not on file   ??? Sexual activity: Not on file     Other Topics Concern   ??? Not on file     Social History Narrative   ??? No narrative on file         ALLERGIES: Review of patient's allergies indicates no known allergies.    Review of Systems   Constitutional: Negative.  Negative for chills, diaphoresis, fatigue and fever.   Gastrointestinal: Negative for abdominal pain, nausea and vomiting.   Genitourinary: Negative.  Negative for decreased urine volume, discharge, dysuria, flank pain, frequency, penile discharge, penile pain, penile  swelling, scrotal swelling, testicular pain and urgency.   All other systems reviewed and are negative.      Vitals:    11/15/16 1515   BP: 138/81   Pulse: 97   Resp: 18   Temp: 98.1 ??F (36.7 ??C)   SpO2: 98%   Weight: 72.6 kg (160 lb)   Height: 5\' 10"  (1.778 m)            Physical Exam   Constitutional: He is oriented to person, place, and time. Vital signs are normal. He appears well-developed and well-nourished. He is active.  Non-toxic appearance. He does not appear ill. No distress.   Cardiovascular: Normal rate, regular rhythm and normal heart sounds.  Exam reveals no gallop and no friction rub.    No murmur heard.  Pulmonary/Chest: Effort normal and breath sounds normal. No accessory muscle usage. No respiratory distress. He has no decreased breath sounds. He has no wheezes. He has no rhonchi.   Genitourinary:   Genitourinary Comments: Declined GU exam   Neurological: He is alert and oriented to person, place, and time.   Skin: Skin is warm and dry.   Psychiatric: He has a normal mood and affect. His behavior is normal.  Nursing note and vitals reviewed.       MDM  Number of Diagnoses or Management Options  Exposure to STD: new and does not require workup  Diagnosis management comments: Patient is treated for Trichomonas exposure with Flagyl.  He is empirically treated for gonorrhea and chlamydia with Rocephin and Zithromax respectively.  He is counseled regarding safer sex And partner notification.    Risk of Complications, Morbidity, and/or Mortality  Presenting problems: minimal  Management options: moderate    Patient Progress  Patient progress: stable        ED Course       Procedures

## 2016-12-12 IMAGING — CR DG HAND COMPLETE 3+V*L*
3 series · 3 of 3 positions shown · non-contrast
Comparison: Radiograph dated 08/15/2012

CLINICAL DATA: 28-year-old male with left hand injury

EXAM:
LEFT HAND - COMPLETE 3+ VIEW

[x hand pa left]
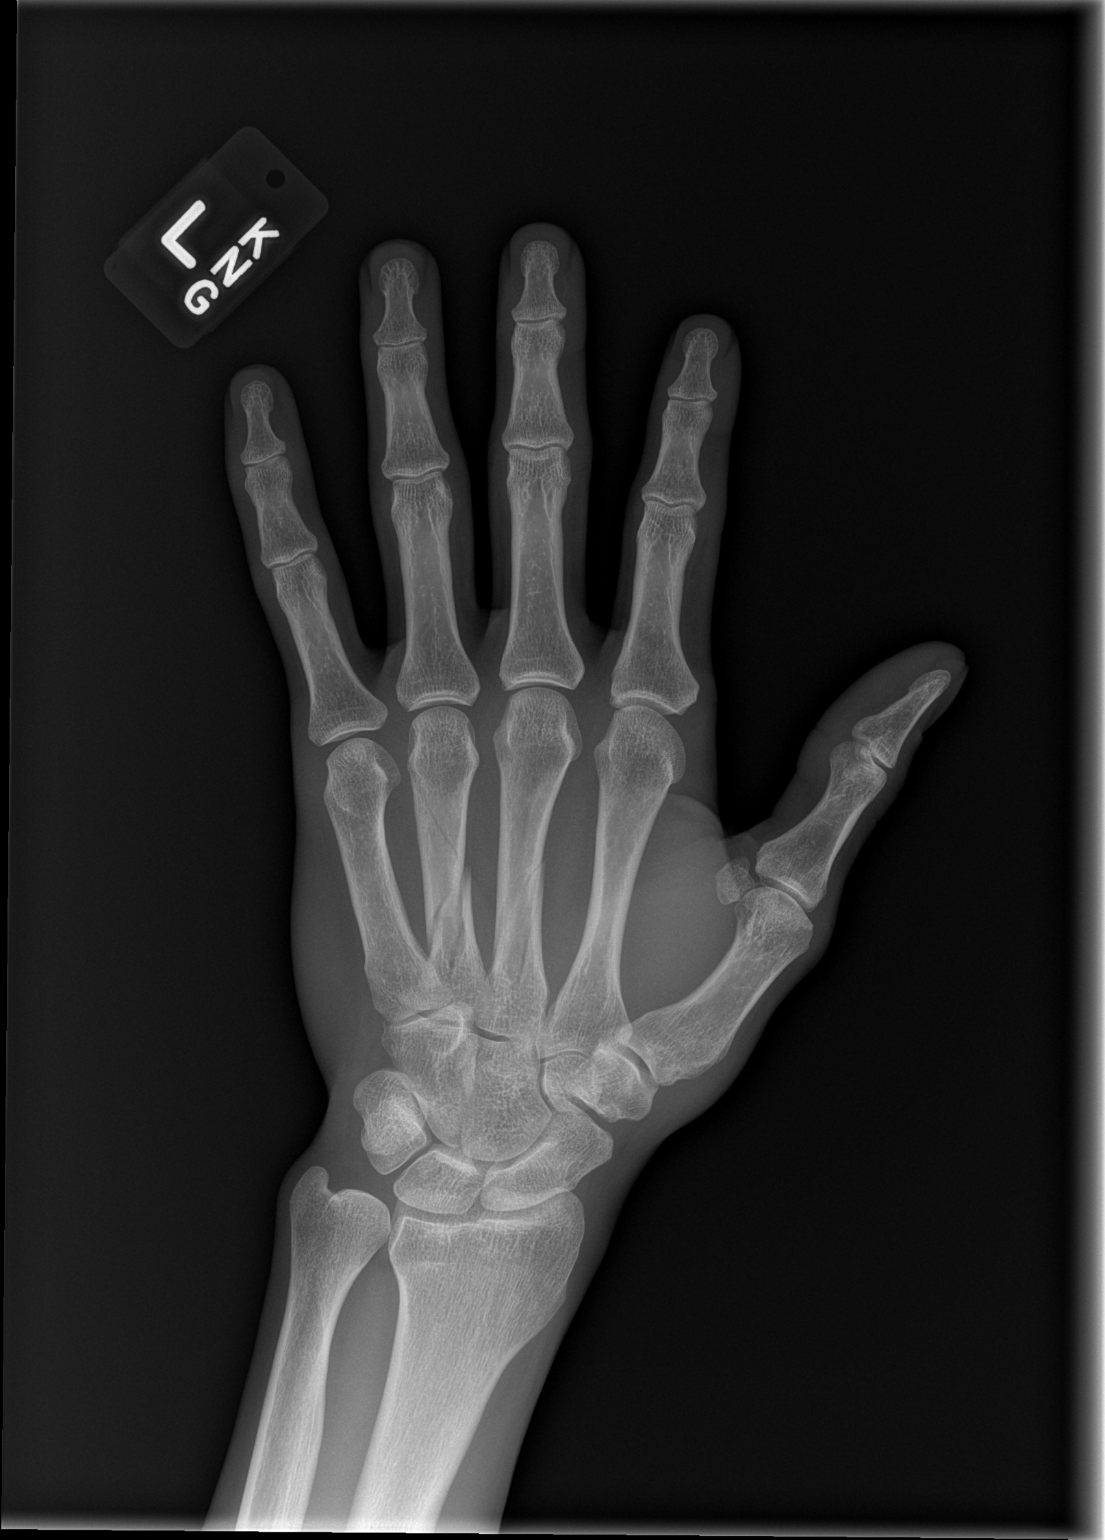

[x hand obl left]
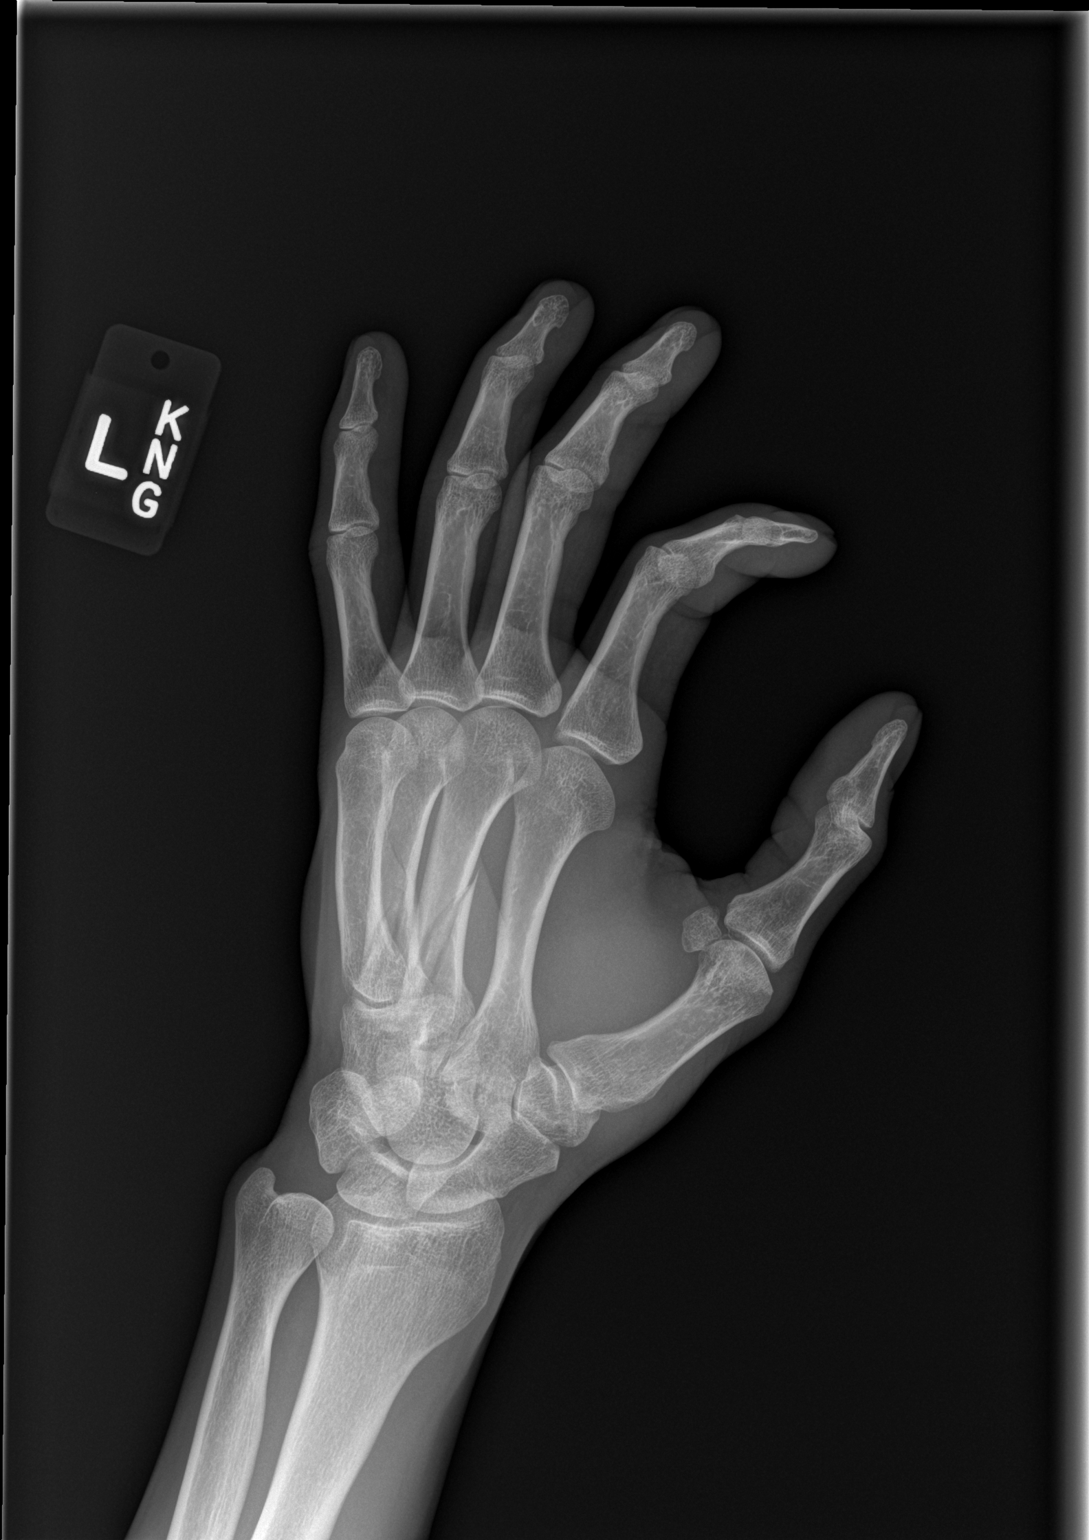

[x hand lat left]
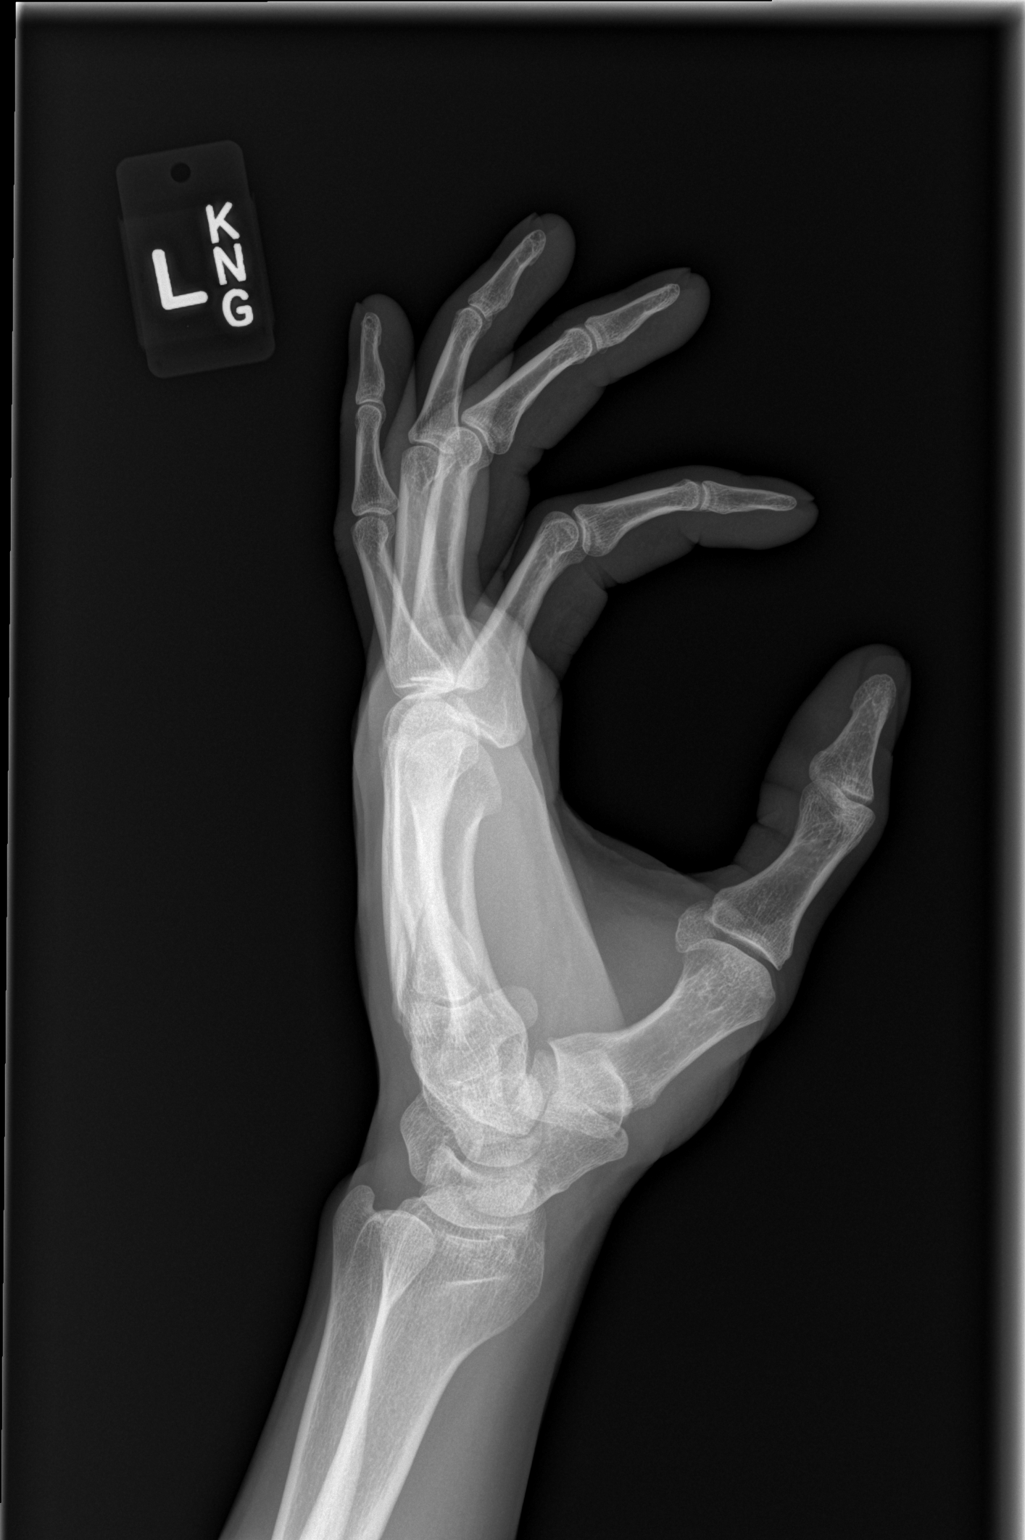

[3 of 3 positions shown; findings below may reference images not displayed]

FINDINGS: There is comminuted appearing oblique fracture of the fourth
metacarpal with approximately 2 mm displacement. There is a
nondisplaced oblique fracture of the third metacarpal. The remainder
of the visualized osseous structures appear unremarkable. There is
soft tissue swelling of the hand. No radiopaque foreign object.
Fractures of the
IMPRESSION: Fractures of the Third and fourth metacarpals.

## 2019-05-13 ENCOUNTER — Inpatient Hospital Stay
Admit: 2019-05-13 | Payer: PRIVATE HEALTH INSURANCE | Primary: Student in an Organized Health Care Education/Training Program

## 2019-05-13 DIAGNOSIS — M6281 Muscle weakness (generalized): Secondary | ICD-10-CM

## 2019-05-13 NOTE — Progress Notes (Signed)
Omar Mclaughlin  DOB: 02-Nov-1985  Primary: [facility]  Secondary:  [place] [facility]  [address] [place] [address]  Phone:[phone]   [phone]        OUTPATIENT OCCUPATIONAL THERAPY: Daily Treatment Note 05/13/2019  Visit Count:  1        Pre-treatment Symptoms/Complaints:  Decreased thumb strength and activity tolerance  Pain: Initial: Pain Intensity 1: 2  Post Sess    minion:  2/10   Medications Last Reviewed:  05/13/2019    Updated Objective Findings:  See evaluation note from today   TREATMENT:     Therapeutic Activity: (   ):     Therapeutic Exercise: (    ): Home program set up with theraputty  exercises.    Manual Therapy: (   ):     [facility] Portal  Treatment/Session Summary:    ?? Response to Treatment:  Tolerated without medical complication.  ?? Communication/Consultation:  None today  ?? Equipment provided today:  Theraputty for home use  ?? Recommendations/Intent for next treatment session: Next visit will focus on strengthening and activity tolerance L thumb/hand.    Total Treatment Billable Duration:   minutes  OT Patient Time In/Time Out  Time In: 0200  Time Out: 0245  Margarette Asal    Future Appointments   Date Time Provider [place]   05/16/2019  8:00 AM Aaron Edelman, OT [facility]   05/20/2019  1:00 PM Aaron Edelman, OT [facility]   05/22/2019 10:00 AM Aaron Edelman, OT [facility]   05/27/2019  1:00 PM Aaron Edelman, OT [facility]   05/29/2019 10:00 AM Aaron Edelman, OT [facility]   06/10/2019  1:00 PM Aaron Edelman, OT [facility]   06/12/2019 10:00 AM Aaron Edelman, OT [facility]

## 2019-05-13 NOTE — Other (Signed)
Sharrell KuFranklin D Cage  DOB: 07/09/1986  Primary: [facility]  Secondary:  [facility]  [address], [address] [place], [place] [address]  [phone]   [phone]         OUTPATIENT OCCUPATIONAL THERAPY:Initial Assessment 05/13/2019   ICD-10: Treatment Diagnosis: M62.81, Muscle weakness, geralized  S60.012A, Contusion of left thumb without damage to nail    Precautions/Allergies:   Patient has no known allergies.   TREATMENT PLAN:  Effective Dates: 05/13/2019 TO 07/13/2019 (60 days).  Frequency/Duration: 2 times a week for 60 Day(s) MEDICAL/REFERRING DIAGNOSIS:  Contusion of left thumb without damage to nail, initial encounter [S60.012A]   DATE OF ONSET: August 17,2020  REFERRING PHYSICIAN: Raul DelMillon, S John, MD  MD Orders: Evaluate and treat  Return MD Appointment: May 21, 2019     INITIAL ASSESSMENT:  Mr. Kennith CenterHines presents as left dominant, left thumb injury February 24, 2019.  He reports that a heavy bar fell on his thumb, which was lodged between the bar an a bolt of cloth.  He was diagnosed with a thumb contusion but reports he was told he had a hairline fracture, possibly of the first metacarpal.  He is 11 weeks status post injury.  He is wearing a thumb spica brace at all times at work.  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  He is here for strengthening.  He is working with a lift restriction of no more than 5 lbs.     PROBLEM LIST (Impacting functional limitations):  1. Decreased Strength  2. Increased Pain  3. Decreased Activity Tolerance INTERVENTIONS PLANNED: (Treatment may consist of any combination of the following)  4. Manual therapy training  5. Modalities  6. Therapeutic activity  7. Therapeutic exercise       GOALS: (Goals have been discussed and agreed upon with patient.)  Short-Term Functional Goals: Time Frame: in 2 weeks  1. Pt will increase left hand key pinch strength to 22 lbs   2. Pt will increase left hand two point pinch strength to 14 lbs  3. Pt will increase left hand three point pinch strength to 16 lbs  Discharge Goals: Time Frame: in 4 weeks     1.   Pt will increase left hand key pinch strength to 24 lbs  2.   Pt will increase left hand two point pinch strength to 16 lbs  3.   Pt will increase left hand three point pinch strength to 20 lbs  4.   Pt will write at work with 0-2/10 fatigue and pain    OUTCOME MEASURE:     Tool Used: Disabilities of the Arm, Shoulder and Hand (DASH) Questionnaire - Quick Version  Score:  Initial: 34/55  Most Recent: X/55 (Date: -- )   Interpretation of Score: The DASH is designed to measure the activities of daily living in person's with upper extremity dysfunction or pain.  Each section is scored on a 1-5 scale, 5 representing the greatest disability.  The scores of each section are added together for a total score of 55.    Score 11 12-19 20-28 29-37 38-45 46-54 55   Modifier CH CI CJ CK CL CM CN     ? Carrying, Moving, and Handling Objects:    [id number] - CURRENT STATUS: CK - 40%-59% impaired, limited or restricted   [id number] - GOAL STATUS: CJ - 20%-39% impaired, limited or restricted   [id number] - D/C STATUS:  ---------------To be  determined---------------        MEDICAL NECESSITY:   ?? Patient is expected to demonstrate progress in strength to improve safety during work.  REASON FOR SERVICES/OTHER COMMENTS:  ?? Patient continues to require skilled intervention due to ongoing left dominant hand weakness following work injury..  Total Duration:  OT Patient Time In/Time Out  Time In: 0200  Time Out: 0245    Rehabilitation Potential For Stated Goals: Good  Regarding Mateo Flow D Cassara's therapy, I certify that the treatment plan above will be carried out by a therapist or under their direction.  Thank you for this referral,  Aaron Edelman, OT     Referring Physician Signature: Marton Redwood, MD _______________________________ Date _____________      PAIN/SUBJECTIVE:   Initial: Pain Intensity 1: 2  Post Session:  2/10   OCCUPATIONAL PROFILE & HISTORY:   History of Injury/Illness (Reason for Referral):  Mr. Flater presents as left dominant, left thumb injury February 24, 2019.  He reports that a heavy bar fell on his thumb, which was lodged between the bar an a bolt of cloth.  He was diagnosed with a thumb contusion but reports he was told he had a hairline fracture, possibly of the first metacarpal.  He is 11 weeks status post injury.  He is wearing a thumb spica brace at all times at work.  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  He is here for strengthening.  He is working with a lift restriction of no more than 5 lbs.  Past Medical History/Comorbidities:   Mr. Strey  has a past medical history of ADD (attention deficit disorder).  Mr. Kaczmarek  has a past surgical history that includes hx orthopaedic.  Social History/Living Environment:   Lives alone independently  Prior Level of Function/Work/Activity:  Independent  Dominant Side:         LEFT   Ambulatory/Rehab Services H2 Model Falls Risk Assessment   Risk Factors:       (1)  Gender [Male] Ability to Rise from Chair:       (0)  Ability to rise in a single movement   Falls Prevention Plan:       No modifications necessary   Total: (5 or greater = High Risk): 1   ??2010 [facility] [place]. [place] Adult nurse [phone]. Federal Law prohibits the replication, distribution or use without written permission from [facility] [facility] [employer]   Current Medications:       Current Outpatient Medications:   ???  dextroamphetamine-amphetamine (ADDERALL) 20 mg tablet, Take 25 mg by mouth., Disp: , Rfl:    Date Last Reviewed:  05/13/2019     Complexity Level: Expanded review of therapy/medical records (1-2):  MODERATE COMPLEXITY   ASSESSMENT OF OCCUPATIONAL PERFORMANCE:   ROM:  WNL:  Left thumb MP 10/55   IP 0/80                  Radial abduction:  45                   Palmar abduction:  60  Strength:  Gross Grip Right:  90 lbs      Gross Grip LEFT:  90 lbs   Pinch strength:        Right         LEFT  Lateral:  24              19 lbs  Two Point                  16               12  Three Point:               25              13    Sensation: Intact; reports tightness in thumb vs pain          Physical Skills Involved:  1. Strength  2. Sensation  3. Activity tolerance Cognitive Skills Affected (resulting in the inability to perform in a timely and safe manner):  1. N/A Psychosocial Skills Affected:  1. Habits/Routines   Number of elements that affect the Plan of Care:: 3-5:  MODERATE COMPLEXITY   CLINICAL DECISION MAKING:   Clinical Decision-Making Assessment:  Patient left dominant with left thumb injury; good AROM and gross grip strength.  Needs thumb strengthening to achieve full use of hand   Assessment process, impact of co-morbidities, assessment modification\\need for assistance, and selection of interventions: Analytical Complexity:MODERATE COMPLEXITY

## 2019-05-13 NOTE — Progress Notes (Signed)
 Progress Notes by Omar Mclaughlin, Omar Mclaughlin at 05/13/19 1400                Author: Gerre Sari Mclaughlin, Omar Mclaughlin  Service: Occupational Therapy  Author Type: Occupational Therapist       Filed: 05/14/19 1200  Date of Service: 05/13/19 1400  Status: Signed          Editor: Omar Mclaughlin, Omar Mclaughlin (Occupational Therapist)                  Omar Mclaughlin   DOB: 1986/03/08   Primary: Omar Mclaughlin   Secondary:   [facility]   [address] [place], [place] [address]   Phone:[phone]   [phone]                  OUTPATIENT OCCUPATIONAL THERAPY: Daily Treatment Note 05/13/2019   Visit Count:  1            Pre-treatment Symptoms/Complaints:  Decreased thumb strength and activity tolerance   Pain:  Initial: Pain Intensity 1: 2   Post Sess    minion:  2/10        Medications Last Reviewed:  05/13/2019      Updated Objective Findings:   See evaluation note from today     TREATMENT:           Therapeutic Activity: (    ):       Therapeutic Exercise: (     ): Home program set up with theraputty  exercises.      Manual Therapy: (   ):       MedBridge Portal   Treatment/Session Summary:       Response to Treatment:  Tolerated without medical complication .     Communication/Consultation:   None today     Equipment provided today:   Theraputty for home use     Recommendations/Intent for next treatment session: Next visit will focus on strengthening and activity tolerance L thumb/hand.      Total Treatment Billable Duration:   minutes   Omar Mclaughlin Patient Time In/Time Out   Time In: 0200   Time Out: 0245   Sari Mclaughlin Omar Omar Mclaughlin      Future Appointments      Date  Time  Provider  Department  Center      05/16/2019   8:00 AM  Omar Mclaughlin, Omar Mclaughlin  [facility]      05/20/2019   1:00 PM  Omar Mclaughlin, Omar Mclaughlin  [facility]      05/22/2019  10:00 AM  Omar Mclaughlin, Omar Mclaughlin  [facility]      05/27/2019   1:00 PM  Omar Mclaughlin, Omar Mclaughlin  [facility]      05/29/2019  10:00 AM   Omar Mclaughlin, Omar Mclaughlin  [facility]      06/10/2019   1:00 PM  Omar Mclaughlin, Omar Mclaughlin  [facility]      06/12/2019  10:00 AM  Omar Mclaughlin, Omar Mclaughlin  [facility]

## 2019-05-13 NOTE — Other (Signed)
Therapy Evaluation by Blanca Friend, OT at 05/13/19 1400                Author: Blanca Friend, OT  Service: Occupational Therapy  Author Type: Occupational Therapist       Filed: 05/14/19 1143  Date of Service: 05/13/19 1400  Status: Signed          Editor: Blanca Friend, OT (Occupational Therapist)                  Myrtie Hawk   DOB: 10/17/1985   Primary: [facility]   Secondary:   [facility]   [address] [place], [place] [address]   [phone]   Fax:[phone]                      OUTPATIENT OCCUPATIONAL THERAPY:Initial Assessment 05/13/2019         ICD-10: Treatment Diagnosis: M62.81, Muscle weakness, geralized   S60.012A, Contusion of left thumb without damage to nail      Precautions/Allergies:    Patient has no known allergies.    TREATMENT PLAN:   Effective Dates: 05/13/2019 TO 07/13/2019 (60 days).  Frequency/Duration : 2 times a week for 60 Day(s)  MEDICAL/REFERRING DIAGNOSIS:   Contusion of left thumb without damage to nail, initial encounter [S60.012A]     DATE OF ONSET: August 17,2020   REFERRING PHYSICIAN: Raul Del, MD   MD Orders: Evaluate and treat   Return MD Appointment: May 21, 2019          INITIAL ASSESSMENT:  Mr. Slane  presents as left dominant, left thumb injury February 24, 2019.  He reports that a heavy bar fell on his thumb, which was lodged between the bar an a bolt of cloth.  He was diagnosed with a thumb contusion but reports he was told he had a hairline fracture,  possibly of the first metacarpal.  He is 11 weeks status post injury.  He is wearing a thumb spica brace at all times at work.  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  He is here for strengthening.  He is working with a lift restriction  of no more than 5 lbs.          PROBLEM LIST (Impacting functional limitations):   1.  Decreased Strength   2.  Increased Pain   3.  Decreased Activity Tolerance  INTERVENTIONS PLANNED:  (Treatment may consist of any combination of the following)   4.  Manual therapy training   5.  Modalities   6.  Therapeutic activity   7.  Therapeutic exercise             GOALS: (Goals have been discussed and agreed upon with patient.)   Short-Term Functional Goals: Time Frame: in 2 weeks   1.  Pt will increase left hand key pinch strength to 22 lbs   2.  Pt will increase left hand two point pinch strength to 14 lbs   3.  Pt will increase left hand three point pinch strength to 16 lbs   Discharge Goals: Time Frame: in 4 weeks      1.   Pt will increase left hand key pinch strength to 24 lbs   2.   Pt will increase left hand two point pinch strength to 16 lbs   3.   Pt will increase left hand three point  pinch strength to 20 lbs   4.   Pt will write at work with 0-2/10 fatigue and pain      OUTCOME MEASURE:       Tool Used: Disabilities of the Arm, Shoulder and Hand (DASH)  Questionnaire - Quick Version   Score:   Initial: 34/55    Most Recent: X/55 (Date: -- )        Interpretation of Score: The DASH is designed to measure the activities  of daily living in person's with upper extremity dysfunction or pain.  Each section is scored on a 1-5 scale, 5 representing the greatest disability.  The scores of each section are added together for a total score of 55.     Score  11  12-19  20-28  29-37  38-45  46-54  55      Modifier  CH  CI  CJ  CK  CL  CM  CN             Carrying, Moving, and Handling Objects:      [id number] - CURRENT STATUS: CK - 40%-59% impaired, limited or restricted    [id number] - GOAL STATUS: CJ - 20%-39% impaired, limited or restricted    [id number] - D/C STATUS:  ---------------To be determined---------------            MEDICAL NECESSITY:      Patient is expected to demonstrate progress in strength to improve safety during work.   REASON FOR SERVICES/OTHER COMMENTS:     Patient continues to require skilled intervention due to ongoing left dominant hand weakness following work injury..   Total Duration:   OT  Patient Time In/Time Out   Time In: 0200   Time Out: 0245      Rehabilitation Potential For Stated Goals: Good   Regarding Susann GivensFranklin D Shackleton's therapy, I certify that the treatment plan above will be carried out by a therapist or under their direction.   Thank you for this referral,   Blanca FriendWendy J Garland, OT      Referring Physician Signature: Raul DelMillon, S John, MD _______________________________ Date _____________          PAIN/SUBJECTIVE:        Initial: Pain Intensity 1: 2    Post Session:  2/10       OCCUPATIONAL  PROFILE & HISTORY:       History of Injury/Illness (Reason for Referral):   Mr. Kennith CenterHines presents as left dominant, left thumb injury February 24, 2019.  He reports that a heavy bar fell on his thumb, which was lodged between  the bar an a bolt of cloth.  He was diagnosed with a thumb contusion but reports he was told he had a hairline fracture, possibly of the first metacarpal.  He is 11 weeks status post injury.  He is wearing a thumb spica brace at all times at work.  He  reports history of left thumb CMC joint injury in 2013.  He is here for strengthening.  He is working with a lift restriction of no more than 5 lbs.   Past Medical History/Comorbidities:    Mr. Kennith CenterHines   has a past medical history of ADD (attention deficit disorder).  Mr.  Kennith CenterHines  has a past surgical history that includes hx orthopaedic.   Social History/Living Environment:    Lives alone independently   Prior Level of Function/Work/Activity:   Independent   Dominant Side:          LEFT  Ambulatory/Rehab Services H2 Model Falls Risk Assessment        Risk Factors:        (1)  Gender [Male]  Ability to Rise from Chair:        (0)  Ability to rise in a single movement       Falls Prevention Plan:        No modifications necessary        Total: (5 or greater = High Risk):  1       2010 [facility] [place]. [place] Adult nurse [phone]. Federal Law prohibits the replication, distribution or use without written  permission  from [facility] [employer]       Current Medications:              Current Outpatient Medications:    ?  dextroamphetamine-amphetamine (ADDERALL) 20 mg tablet, Take 25 mg by mouth., Disp: , Rfl:        Date Last Reviewed:  05/13/2019           Complexity Level:  Expanded review of therapy/medical records (1-2):  MODERATE COMPLEXITY        ASSESSMENT OF OCCUPATIONAL PERFORMANCE:       ROM:  WNL:   Left thumb MP 10/55   IP 0/80                   Radial abduction:  45                   Palmar abduction:  60   Strength:  Gross Grip Right:  90 lbs      Gross Grip LEFT:  90 lbs    Pinch strength:        Right         LEFT   Lateral:                      24              19 lbs   Two Point                  16               12   Three Point:               25              13      Sensation: Intact; reports tightness in thumb vs pain                 Physical Skills Involved:   1.  Strength   2.  Sensation   3.  Activity tolerance  Cognitive Skills Affected (resulting in  the inability to perform in a timely and safe manner):   1.  N/A  Psychosocial Skills Affected:   1.  Habits/Routines        Number of elements that affect the Plan of Care::  3-5:  MODERATE COMPLEXITY        CLINICAL DECISION MAKING:       Clinical Decision-Making Assessment:  Patient left dominant with left thumb injury;  good AROM and gross grip strength.  Needs thumb strengthening to achieve full use of hand        Assessment process, impact of co-morbidities, assessment modification\need for assistance, and selection of interventions :  Analytical Complexity:MODERATE COMPLEXITY

## 2019-05-16 ENCOUNTER — Inpatient Hospital Stay
Admit: 2019-05-16 | Payer: PRIVATE HEALTH INSURANCE | Primary: Student in an Organized Health Care Education/Training Program

## 2019-05-16 NOTE — Progress Notes (Signed)
Omar Mclaughlin  DOB: April 17, 1986  Primary: [facility]  Secondary:  [place] [facility]  [address], [place] [address]  Phone:[phone]   [phone]        OUTPATIENT OCCUPATIONAL THERAPY: Daily Treatment Note 05/16/2019  Visit Count:  2        Pre-treatment Symptoms/Complaints:  Worked with less thumb fatigue  Pain: Initial: Pain Intensity 1: 2  Post Sess    minion:  2/10   Medications Last Reviewed:  05/16/2019    Updated Objective Findings:  None Today   TREATMENT:     Therapeutic Activity: (   ):     Therapeutic Exercise: (   60 minutes ): Theraputty isolated thumb strengthening and pinch strengthening, pulling and placing buttons in theraputty with left hand; graded clothespins utilizing two point and three point pinch and overhead reach; velcro board utilizing lateral pinch and wrist rotation; L UE in fluidotherapy and completing tendon glides, wrist AROM and thumb flexion, extension, palmar and radial abduction, thumb rotation in dry heat modality    Manual Therapy: (   ):     Med[facility] Portal  Treatment/Session Summary:    ?? Response to Treatment:  Tolerated without medical complication.  ?? Communication/Consultation:  None today  ?? Equipment provided today:  None today  ?? Recommendations/Intent for next treatment session: Next visit will focus on strengthening and activity tolerance L thumb/hand.    Total Treatment Billable Duration:  60 minutes  OT Patient Time In/Time Out  Time In: 0800  Time Out: 0900  [name], CLT    Future Appointments   Date Time Provider [place]   05/20/2019  1:00 PM Aaron Edelman, [place] [facility]   05/22/2019 10:00 AM Aaron Edelman, OT [facility]   05/27/2019  1:00 PM Aaron Edelman, OT [facility]   05/29/2019 10:00 AM Aaron Edelman, OT [facility]   06/10/2019  1:00 PM Aaron Edelman, OT [facility]   06/12/2019 10:00 AM Aaron Edelman, OT [facility]

## 2019-05-16 NOTE — Progress Notes (Signed)
Progress Notes by Blanca Friend, OT at 05/16/19 1610                Author: Blanca Friend, OT  Service: Occupational Therapy  Author Type: Occupational Therapist       Filed: 05/16/19 0903  Date of Service: 05/16/19 0859  Status: Signed          Editor: Blanca Friend, OT (Occupational Therapist)                  Omar Mclaughlin   DOB: 1985/12/24   Primary: [facility]   Secondary:   [facility]   [address], [address] [place], [place] [address]   Phone:[phone]   Fax:[phone]                  OUTPATIENT OCCUPATIONAL THERAPY: Daily Treatment Note 05/16/2019   Visit Count:  2            Pre-treatment Symptoms/Complaints:  Worked with less thumb fatigue   Pain:  Initial: Pain Intensity 1: 2   Post Sess    minion:  2/10        Medications Last Reviewed:  05/16/2019      Updated Objective Findings:   None Today     TREATMENT:           Therapeutic Activity: (    ):       Therapeutic Exercise: (    60 minutes ): Theraputty isolated thumb strengthening and pinch strengthening, pulling and placing buttons in theraputty with left hand;  graded clothespins utilizing two point and three point pinch and overhead reach; velcro board utilizing lateral pinch and wrist rotation; L UE in fluidotherapy and completing tendon glides, wrist AROM and thumb flexion, extension, palmar and radial abduction,  thumb rotation in dry heat modality      Manual Therapy: (   ):       Med[facility] Portal   Treatment/Session Summary:       Response to Treatment:  Tolerated without medical complication .     Communication/Consultation:   None today     Equipment provided today:   None today     Recommendations/Intent for next treatment session: Next visit will focus on strengthening and activity tolerance L thumb/hand.      Total Treatment Billable Duration:  60 minutes   OT Patient Time In/Time Out   Time In: 0800   Time Out: 0900   Irene Shipper, CLT      Future Appointments       Date  Time  Provider  Department  Center      05/20/2019   1:00 PM  Blanca Friend, [place]  [facility]      05/22/2019  10:00 AM  Blanca Friend, OT  [facility]      05/27/2019   1:00 PM  Blanca Friend, OT  [facility]      05/29/2019  10:00 AM  Blanca Friend, OT  [facility]      06/10/2019   1:00 PM  Blanca Friend, OT  [facility]      06/12/2019  10:00 AM  Blanca Friend, OT  [facility]

## 2019-05-20 ENCOUNTER — Inpatient Hospital Stay
Admit: 2019-05-20 | Payer: PRIVATE HEALTH INSURANCE | Primary: Student in an Organized Health Care Education/Training Program

## 2019-05-20 NOTE — Progress Notes (Signed)
Omar Mclaughlin  DOB: 1986-04-06  Primary: [facility]  Secondary:  [place] [facility]  [address] [place] [address]  Phone:[phone]   [phone]        OUTPATIENT OCCUPATIONAL THERAPY: Daily Treatment Note 05/20/2019  Visit Count:  3        Pre-treatment Symptoms/Complaints:  Feels thumb is getting stronger  Pain: Initial: Pain Intensity 1: 2  Post Session:  2/10   Medications Last Reviewed:  05/20/2019    Updated Objective Findings:  None Today   TREATMENT:     Therapeutic Activity: (   ):     Therapeutic Exercise: ( 60 minutes ): Theraputty isolated thumb strengthening and pinch strengthening, pulling and placing buttons in theraputty with left hand; graded clothespins utilizing two point and three point pinch and overhead reach; hand helper isolated thumb adduction strengthening; L UE in fluidotherapy and completing tendon glides, wrist AROM and thumb flexion, extension, palmar and radial abduction, thumb rotation in dry heat modality    Manual Therapy: (   ):     MedBridge Portal  Treatment/Session Summary:    ?? Response to Treatment:  Tolerated without medical complication.  ?? Communication/Consultation:  None today  ?? Equipment provided today:  None today  ?? Recommendations/Intent for next treatment session: Next visit will focus on strengthening and activity tolerance L thumb/hand.    Total Treatment Billable Duration:  60 minutes  OT Patient Time In/Time Out  Time In: 0100  Time Out: 0200  [name], CLT    Future Appointments   Date Time Provider [place]   05/22/2019 10:00 AM Aaron Edelman, OT [facility]   05/27/2019  1:00 PM Aaron Edelman, OT [facility]   05/29/2019 10:00 AM Aaron Edelman, OT [facility]   06/10/2019  1:00 PM Aaron Edelman, OT [facility]   06/12/2019 10:00 AM Aaron Edelman, OT [facility]

## 2019-05-20 NOTE — Progress Notes (Signed)
 Progress Notes by Gerre Sari PARAS, OT at 05/20/19 1338                Author: Gerre Sari PARAS, OT  Service: Occupational Therapy  Author Type: Occupational Therapist       Filed: 05/20/19 1345  Date of Service: 05/20/19 1338  Status: Signed          Editor: Gerre Sari PARAS, OT (Occupational Therapist)                  Omar Mclaughlin   DOB: [birth year]   Primary: Omar Mclaughlin   Secondary:   [facility]   [address] [place], [place] [address]   [phone]   Fax:[phone]                  OUTPATIENT OCCUPATIONAL THERAPY: Daily Treatment Note 05/20/2019   Visit Count:  3            Pre-treatment Symptoms/Complaints:  Feels thumb is getting stronger   Pain:  Initial: Pain Intensity 1: 2   Post Session:  2/10        Medications Last Reviewed:  05/20/2019      Updated Objective Findings:   None Today     TREATMENT:           Therapeutic Activity: (    ):       Therapeutic Exercise: (  60 minutes ): Theraputty isolated thumb strengthening and pinch strengthening, pulling and placing buttons in theraputty with left hand;  graded clothespins utilizing two point and three point pinch and overhead reach; hand helper isolated thumb adduction strengthening; L UE in fluidotherapy and completing tendon glides, wrist AROM and thumb flexion, extension, palmar and radial abduction,  thumb rotation in dry heat modality      Manual Therapy: (   ):       MedBridge Portal   Treatment/Session Summary:       Response to Treatment:  Tolerated without medical complication .     Communication/Consultation:   None today     Equipment provided today:   None today     Recommendations/Intent for next treatment session: Next visit will focus on strengthening and activity tolerance L thumb/hand.      Total Treatment Billable Duration:  60 minutes   OT Patient Time In/Time Out   Time In: 0100   Time Out: 0200   Sari PARAS Gerre NEILA, CLT      Future Appointments      Date  Time   Provider  Department  Center      05/22/2019  10:00 AM  Gerre Sari PARAS, OT  [facility]      05/27/2019   1:00 PM  Gerre Sari PARAS, OT  [facility]      05/29/2019  10:00 AM  Gerre Sari PARAS, OT  [facility]      06/10/2019   1:00 PM  Gerre Sari PARAS, OT  [facility]      06/12/2019  10:00 AM  Gerre Sari PARAS, OT  [facility]

## 2019-05-22 ENCOUNTER — Inpatient Hospital Stay
Admit: 2019-05-22 | Payer: PRIVATE HEALTH INSURANCE | Primary: Student in an Organized Health Care Education/Training Program

## 2019-05-22 NOTE — Progress Notes (Signed)
Kareen Hitsman Middlebrooks  DOB: 07/25/1985  Primary: [facility]  Secondary:  [facility] [facility]  [address] [place] [address]  [phone]   [phone]        OUTPATIENT OCCUPATIONAL THERAPY: Daily Treatment Note 05/22/2019  Visit Count:  4        Pre-treatment Symptoms/Complaints: "Thumb feels stiff today"  Pain: Initial: Pain Intensity 1: 2  Post Session:  2/10   Medications Last Reviewed:  05/22/2019    Updated Objective Findings:  None Today   TREATMENT:     Therapeutic Activity: (   ):     Therapeutic Exercise: ( 60 minutes ): Theraputty isolated thumb strengthening and pinch strengthening, pulling and placing buttons in theraputty with left hand; graded clothespins utilizing two point and three point pinch and overhead reach; hand helper isolated thumb adduction strengthening; L UE in fluidotherapy and completing tendon glides, wrist AROM and thumb flexion, extension, palmar and radial abduction, thumb rotation in dry heat modality    Manual Therapy: (   ):     Med[facility] Portal  Treatment/Session Summary:    ?? Response to Treatment:  Tolerated without medical complication.  ?? Communication/Consultation:  None today  ?? Equipment provided today:  None today  ?? Recommendations/Intent for next treatment session: Next visit will focus on strengthening and activity tolerance L thumb/hand.    Total Treatment Billable Duration:  60 minutes  OT Patient Time In/Time Out  Time In: 1000  Time Out: 1045  [name], [place]    Future Appointments   Date Time Provider [place]   05/27/2019  1:00 PM Aaron Edelman, OT [facility]   05/29/2019 10:00 AM Aaron Edelman, OT [facility]   06/10/2019  1:00 PM Aaron Edelman, OT [facility]   06/12/2019 10:00 AM Aaron Edelman, OT [facility]

## 2019-05-22 NOTE — Progress Notes (Signed)
 Progress Notes by Gerre Sari PARAS, OT at 05/22/19 1008                Author: Gerre Sari PARAS, OT  Service: Occupational Therapy  Author Type: Occupational Therapist       Filed: 05/22/19 1034  Date of Service: 05/22/19 1008  Status: Signed          Editor: Gerre Sari PARAS, OT (Occupational Therapist)                  Omar Mclaughlin   DOB: 07-Aug-1985   Primary: Omar Mclaughlin   Secondary:   [facility]   [address] [place], [place] [address]   Phone:[phone]   Fax:[phone]                  OUTPATIENT OCCUPATIONAL THERAPY: Daily Treatment Note 05/22/2019   Visit Count:  4            Pre-treatment Symptoms/Complaints: Thumb feels stiff today   Pain:  Initial: Pain Intensity 1: 2   Post Session:  2/10        Medications Last Reviewed:  05/22/2019      Updated Objective Findings:   None Today     TREATMENT:           Therapeutic Activity: (    ):       Therapeutic Exercise: (  60 minutes ): Theraputty isolated thumb strengthening and pinch strengthening, pulling and placing buttons in theraputty with left  hand; graded clothespins utilizing two point and three point pinch and overhead reach; hand helper isolated thumb adduction strengthening; L UE in fluidotherapy and completing tendon glides, wrist AROM and thumb flexion, extension, palmar and radial abduction,  thumb rotation in dry heat modality      Manual Therapy: (   ):       MedBridge Portal   Treatment/Session Summary:       Response to Treatment:  Tolerated without medical complication .     Communication/Consultation:   None today     Equipment provided today:   None today     Recommendations/Intent for next treatment session: Next visit will focus on strengthening and activity tolerance L thumb/hand.      Total Treatment Billable Duration:  60 minutes   OT Patient Time In/Time Out   Time In: 1000   Time Out: 1045   Sari PARAS Gerre NEILA, [place]      Future Appointments      Date  Time   Provider  Department  Center      05/27/2019   1:00 PM  Gerre Sari PARAS, OT  [facility]      05/29/2019  10:00 AM  Gerre Sari PARAS, OT  [facility]      06/10/2019   1:00 PM  Gerre Sari PARAS, OT  [facility]      06/12/2019  10:00 AM  Gerre Sari PARAS, OT  [facility]

## 2019-05-27 ENCOUNTER — Inpatient Hospital Stay
Admit: 2019-05-27 | Payer: PRIVATE HEALTH INSURANCE | Primary: Student in an Organized Health Care Education/Training Program

## 2019-05-27 NOTE — Progress Notes (Signed)
Omar Mclaughlin  DOB: 21-Jun-1986  Primary: [facility]  Secondary:  [place] [facility]  [address] [place] [address]  Phone:[phone]   [phone]        OUTPATIENT OCCUPATIONAL THERAPY: Daily Treatment Note 05/27/2019  Visit Count:  5        Pre-treatment Symptoms/Complaints: Pt with increased thumb pain due to overuse at work.  Increased swelling thenar eminance and CMC joint  Pain: Initial: Pain Intensity 1: 4/10  Post Session:  2/10   Medications Last Reviewed:  05/27/2019    Updated Objective Findings:  None Today   TREATMENT:     Therapeutic Activity: (   ):     Therapeutic Exercise: ( 15 minutes ):  L UE in fluidotherapy and completing tendon glides, wrist AROM and thumb flexion, extension, palmar and radial abduction, thumb rotation in dry heat modality    Manual Therapy: ( 30 minutes  ):  Edema massage left wrist and thumb area.  Kineseotaped for swelling with anchor at antecubital nodes and 4 finger tapes draped to anterior radial wrist and thenar eminence.      Ultrasound: (10 minutes):  Small head, 3.3 mHz, 0.7 W/cm2 x 9 minutes    MedBridge Portal  Treatment/Session Summary:    ?? Response to Treatment:  Tolerated without medical complication.  Recommended patient wear his thumb/wrist splint at work when doing heavy resistive activity in order to protect CMC joint and reduce risk of reinjury/pain.  ?? Communication/Consultation:  None today  ?? Equipment provided today:  None today  ?? Recommendations/Intent for next treatment session: Next visit will focus on strengthening and activity tolerance L thumb/hand.    Total Treatment Billable Duration:  55 minutes  OT Patient Time In/Time Out  Time In: 0100  Time Out: 0200  [name], CLT    Future Appointments   Date Time Provider [place]   05/29/2019 10:00 AM Aaron Edelman, OT [facility]   06/10/2019  1:00 PM Aaron Edelman, OT [facility]    06/12/2019 10:00 AM Aaron Edelman, OT [facility]

## 2019-05-27 NOTE — Progress Notes (Signed)
 Progress Notes by Gerre Sari PARAS, OT at 05/27/19 1344                Author: Gerre Sari PARAS, OT  Service: Occupational Therapy  Author Type: Occupational Therapist       Filed: 05/27/19 1352  Date of Service: 05/27/19 1344  Status: Signed          Editor: Gerre Sari PARAS, OT (Occupational Therapist)                  Omar JONETTA Pa   DOB: 1986/04/10   Primary: [name]   Secondary:   [facility]   [address] [place], [place] [address]   Phone:[phone]   [phone]                  OUTPATIENT OCCUPATIONAL THERAPY: Daily Treatment Note 05/27/2019   Visit Count:  5            Pre-treatment Symptoms/Complaints: Pt with increased thumb pain due to overuse at work.  Increased  swelling thenar eminance and CMC joint   Pain:  Initial: Pain Intensity 1: 4/10   Post Session:  2/10        Medications Last Reviewed:  05/27/2019      Updated Objective Findings:   None Today     TREATMENT:           Therapeutic Activity: (    ):       Therapeutic Exercise: (  15 minutes ):  L UE in fluidotherapy and completing tendon glides, wrist AROM and thumb flexion, extension, palmar and radial abduction,  thumb rotation in dry heat modality      Manual Therapy: ( 30 minutes  ):  Edema massage left wrist and thumb area.  Kineseotaped for swelling with anchor at antecubital  nodes and 4 finger tapes draped to anterior radial wrist and thenar eminence.        Ultrasound: (10 minutes):  Small head, 3.3 mHz, 0.7 W/cm2 x 9 minutes      MedBridge Portal   Treatment/Session Summary:       Response to Treatment:  Tolerated without medical complication .  Recommended patient wear his thumb/wrist splint at work when doing heavy resistive activity in order to protect CMC joint and reduce risk of reinjury/pain.     Communication/Consultation:   None today     Equipment provided today:   None today     Recommendations/Intent for next treatment session: Next visit will focus on  strengthening and activity tolerance L thumb/hand.      Total Treatment Billable Duration:  55 minutes   OT Patient Time In/Time Out   Time In: 0100   Time Out: 0200   Sari PARAS Gerre NEILA, CLT      Future Appointments      Date  Time  Provider  Department  Center      05/29/2019  10:00 AM  Gerre Sari PARAS, OT  [facility]      06/10/2019   1:00 PM  Gerre Sari PARAS, OT  [facility]      06/12/2019  10:00 AM  Gerre Sari PARAS, OT  [facility]

## 2019-05-29 ENCOUNTER — Inpatient Hospital Stay
Admit: 2019-05-29 | Payer: PRIVATE HEALTH INSURANCE | Primary: Student in an Organized Health Care Education/Training Program

## 2019-05-29 NOTE — Progress Notes (Signed)
Omar Mclaughlin  DOB: 07-17-1985  Primary: [facility]  Secondary:  [place] [facility] [place]  [address], [place] [address]  Phone:[phone]   [phone]        OUTPATIENT OCCUPATIONAL THERAPY: Daily Treatment Note 05/29/2019  Visit Count:  6        Pre-treatment Symptoms/Complaints:  Pt with pain in thumb CMC joint; strength is functional for work with activity tolerance improving  Pain: Initial: Pain Intensity 1: 3/10  Post Session:  2/10   Medications Last Reviewed:  05/29/2019    Updated Objective Findings:  None Today   TREATMENT:     Therapeutic Activity: (   ):     Therapeutic Exercise: ( 20 minutes ):  L UE in fluidotherapy and completing tendon glides, wrist AROM and thumb flexion, extension, palmar and radial abduction, thumb rotation in dry heat modality    Manual Therapy: (   ):     Ultrasound: (10 minutes):  Small head, 3.3 mHz, 0.7 W/cm2 x 9 minutes    MedBridge Portal  Treatment/Session Summary:    ?? Response to Treatment:  Tolerated without medical complication.  Recommended patient wear his thumb/wrist splint at work when doing heavy resistive activity in order to protect CMC joint and reduce risk of reinjury/pain.  Treatment focus on pain relief as strength and AROM are WFL; pt is working at full capacity at work.  ?? Communication/Consultation:  None today  ?? Equipment provided today:  None today  ?? Recommendations/Intent for next treatment session: Next visit will focus on strengthening and activity tolerance L thumb/hand.    Total Treatment Billable Duration:  30 minutes  OT Patient Time In/Time Out  Time In: 1010  Time Out: 1040  Margarette Asal    Future Appointments   Date Time Provider [place]   06/10/2019  1:00 PM Aaron Edelman, [place] [facility]   06/12/2019 10:00 AM Aaron Edelman, OT [facility]

## 2019-05-29 NOTE — Progress Notes (Signed)
 Progress Notes by Gerre Sari PARAS, OT at 05/29/19 1037                Author: Gerre Sari PARAS, OT  Service: Occupational Therapy  Author Type: Occupational Therapist       Filed: 05/29/19 1040  Date of Service: 05/29/19 1037  Status: Signed          Editor: Gerre Sari PARAS, OT (Occupational Therapist)                  Omar Mclaughlin   DOB: 12-01-1985   Primary: Omar Mclaughlin   Secondary:   [facility]   [address] [place], [place] [address]   [phone]   [phone]                  OUTPATIENT OCCUPATIONAL THERAPY: Daily Treatment Note 05/29/2019   Visit Count:  6            Pre-treatment Symptoms/Complaints:  Pt with pain in thumb CMC joint; strength is functional for  work with activity tolerance improving   Pain:  Initial: Pain Intensity 1: 3/10   Post Session:  2/10        Medications Last Reviewed:  05/29/2019      Updated Objective Findings:   None Today     TREATMENT:           Therapeutic Activity: (    ):       Therapeutic Exercise: (  20 minutes ):  L UE in fluidotherapy and completing tendon glides, wrist AROM and thumb flexion, extension, palmar and radial abduction,  thumb rotation in dry heat modality      Manual Therapy: (   ):       Ultrasound: (10 minutes):  Small head, 3.3 mHz, 0.7 W/cm2 x 9 minutes      MedBridge Portal   Treatment/Session Summary:       Response to Treatment:  Tolerated without medical complication .  Recommended patient wear his thumb/wrist splint at work when doing heavy resistive activity in order to protect CMC joint and reduce risk of reinjury/pain.  Treatment focus on pain relief as strength and AROM are WFL; pt is working at full capacity  at work.     Communication/Consultation:   None today     Equipment provided today:   None today     Recommendations/Intent for next treatment session: Next visit will focus on strengthening and activity tolerance L thumb/hand.      Total Treatment Billable  Duration:  30 minutes   OT Patient Time In/Time Out   Time In: 1010   Time Out: 1040   [name]      Future Appointments      Date  Time  Provider  Department  Center      06/10/2019   1:00 PM  Gerre Sari PARAS, [place]  [facility]      06/12/2019  10:00 AM  Gerre Sari PARAS, OT  [facility]

## 2019-06-10 ENCOUNTER — Inpatient Hospital Stay
Admit: 2019-06-10 | Payer: PRIVATE HEALTH INSURANCE | Primary: Student in an Organized Health Care Education/Training Program

## 2019-06-10 DIAGNOSIS — M6281 Muscle weakness (generalized): Secondary | ICD-10-CM

## 2019-06-10 NOTE — Progress Notes (Signed)
Omar Mclaughlin  DOB: Aug 29, 1985  Primary: [facility]  Secondary:  [place] [facility]  [address], [place] [address]  Phone:[phone]   [phone]        OUTPATIENT OCCUPATIONAL THERAPY: Daily Treatment Note 06/10/2019  Visit Count:  7        Pre-treatment Symptoms/Complaints:  Pt reports thumb is less painful; has occasional shooting pains when doing specific activities such as using scissors or gripping a pen for an extended amount of time  Pain: Initial: Pain Intensity 1: 3/10  Post Session:  2/10   Medications Last Reviewed:  06/10/2019    Updated Objective Findings:  None Today   TREATMENT:     Therapeutic Activity: (   ):     Therapeutic Exercise: ( 50 minutes ):  Graded clothespins, theraputty isolated and composite resistive hand movements, L UE in fluidotherapy and completing tendon glides, wrist AROM and thumb flexion, extension, palmar and radial abduction, thumb rotation in dry heat modality; provided grip extender tubing, and adaptive pen to reduce stress on thumb joints and information on spring loaded scissors to reduce thumb pain.    Manual Therapy: (   ):     Ultrasound: (10 minutes):  Small head, 3.3 mHz, 0.7 W/cm2 x 9 minutes at thumb CMC  and MP joints    MedBridge Portal  Treatment/Session Summary:    ?? Response to Treatment:  Tolerated without medical complication.  Recommended patient wear his thumb/wrist splint at work when doing heavy resistive activity in order to protect CMC joint and reduce risk of reinjury/pain.  Discussed joint protection techniques and available adaptive devices to reduce stress on thumb  ?? Communication/Consultation:  None today  ?? Equipment provided today:  Cylindrical foam, adaptive pen  ?? Recommendations/Intent for next treatment session: Next visit will focus on discharge planning    Total Treatment Billable Duration:  60 minutes  OT Patient Time In/Time Out  Time In: 0100  Time Out: 0200   [name], CLT    Future Appointments   Date Time Provider [place]   06/12/2019 10:00 AM Aaron Edelman, OT [facility]

## 2019-06-10 NOTE — Progress Notes (Signed)
 Progress Notes by Gerre Sari PARAS, OT at 06/10/19 1354                Author: Gerre Sari PARAS, OT  Service: Occupational Therapy  Author Type: Occupational Therapist       Filed: 06/10/19 1400  Date of Service: 06/10/19 1354  Status: Signed          Editor: Gerre Sari PARAS, OT (Occupational Therapist)                  Omar Mclaughlin   DOB: 12/14/1985   Primary: Omar Mclaughlin   Secondary:   [facility]   [address], [address] [place], [place] [address]   Phone:[phone]   [phone]                  OUTPATIENT OCCUPATIONAL THERAPY: Daily Treatment Note 06/10/2019   Visit Count:  7            Pre-treatment Symptoms/Complaints:  Pt reports thumb is less painful; has occasional shooting pains when doing  specific activities such as using scissors or gripping a pen for an extended amount of time   Pain:  Initial: Pain Intensity 1: 3/10   Post Session:  2/10        Medications Last Reviewed:  06/10/2019      Updated Objective Findings:   None Today     TREATMENT:           Therapeutic Activity:  (   ):       Therapeutic Exercise:  ( 50 minutes ):  Graded clothespins, theraputty isolated and composite resistive  hand movements, L UE in fluidotherapy and completing tendon glides, wrist AROM and thumb flexion, extension, palmar and radial abduction, thumb rotation in dry heat modality; provided grip extender tubing, and adaptive pen to reduce stress on thumb joints  and information on spring loaded scissors to reduce thumb pain.      Manual Therapy: (   ):       Ultrasound: (10 minutes):  Small head, 3.3 mHz, 0.7 W/cm2 x 9 minutes at thumb CMC  and MP joints      MedBridge Portal   Treatment/Session Summary:       Response to Treatment:  Tolerated without medical complication.  Recommended  patient wear his thumb/wrist splint at work when doing heavy resistive activity in order to protect CMC joint and reduce risk of reinjury/pain.  Discussed joint protection techniques  and available adaptive devices to reduce stress on thumb     Communication/Consultation:  None today     Equipment provided today:  Cylindrical foam, adaptive pen     Recommendations/Intent for next treatment session: Next visit will focus on discharge planning      Total Treatment Billable Duration:  60 minutes   OT Patient Time In/Time Out   Time In: 0100   Time Out: 0200   [name]      Future Appointments      Date  Time  Provider  Department  Center      06/12/2019  10:00 AM  Gerre Sari PARAS, OT  [facility]  [facility]

## 2019-06-12 ENCOUNTER — Inpatient Hospital Stay
Admit: 2019-06-12 | Payer: PRIVATE HEALTH INSURANCE | Primary: Student in an Organized Health Care Education/Training Program

## 2019-06-12 NOTE — Progress Notes (Signed)
Shontez Sermon Leas  DOB: 12/02/1985  Primary: [facility]  Secondary:  [place] [facility]  [address], [place] [address]  [phone]   [phone]        OUTPATIENT OCCUPATIONAL THERAPY: Daily Treatment Note 06/12/2019  Visit Count:  8        Pre-treatment Symptoms/Complaints:  Reports 0-2 pain at work; improved strength.  In agreement with discharge today    Pain: Initial: Pain Intensity 1: 1/10  Post Session:  0/10   Medications Last Reviewed:  06/12/2019    Updated Objective Findings:  See discharge summary   TREATMENT:     Therapeutic Activity: (   ):     Therapeutic Exercise: ( 50 minutes ):  Graded clothespins, theraputty isolated and composite resistive hand movements, L UE in fluidotherapy and completing tendon glides, wrist AROM and thumb flexion, extension, palmar and radial abduction, thumb rotation in dry heat modality    Manual Therapy: (   ):     Ultrasound: (10 minutes):  Small head, 3.3 mHz, 0.7 W/cm2 x 9 minutes at thumb CMC  and MP joints    MedBridge Portal  Treatment/Session Summary:    ?? Response to Treatment:  Pt has achieved maximum benefits from therapy.  He is independent with HEP and has increased thumb strength.  Pt discharged from therapy today.  ?? Communication/Consultation:  None today  ?? Equipment provided today: None today  ?? Recommendations/Intent for next treatment session:  Discharge today    Total Treatment Billable Duration:  60 minutes  OT Patient Time In/Time Out  Time In: 1000  Time Out: 1100  Evona Westra J Karliah Kowalchuk, OTR/L, CLT    No future appointments.

## 2019-06-12 NOTE — Other (Signed)
Omar Mclaughlin  DOB: 1985/11/07  Primary: [facility]  Secondary:  [place] [facility]  [address], [place] [address]  [phone]   [phone]         OUTPATIENT OCCUPATIONAL THERAPY:Initial Assessment 06/12/2019   ICD-10: Treatment Diagnosis: M62.81, Muscle weakness, geralized  S60.012A, Contusion of left thumb without damage to nail    Precautions/Allergies:   Patient has no known allergies.   TREATMENT PLAN:  Effective Dates: 05/13/2019 TO 07/13/2019 (60 days).  Frequency/Duration: 2 times a week for 60 Day(s)   MEDICAL/REFERRING DIAGNOSIS:  Contusion of left thumb without damage to nail, initial encounter [S60.012A]   DATE OF ONSET: August 17,2020  REFERRING PHYSICIAN: Marton Redwood, MD  MD Orders: Evaluate and treat  Return MD Appointment: May 21, 2019   DISCHARGE SUMMARY 06/12/2019:  Omar Mclaughlin completed 12 occupational therapy treatment sessions.  He has improved his thumb strength and reduced pain with resistive activity.  He is functioning at full capacity at work, applying pacing and joint protection techniques. DASH Outcome Measurement score decreased from 50% impairment or restriction to 10% impairment or restriction.  Maximum benefits achieved in therapy.  Pt is in agreement with discharge today.    INITIAL ASSESSMENT:  Omar Mclaughlin presents as left dominant, left thumb injury February 24, 2019.  He reports that a heavy bar fell on his thumb, which was lodged between the bar an a bolt of cloth.  He was diagnosed with a thumb contusion but reports he was told he had a hairline fracture, possibly of the first metacarpal.  He is 11 weeks status post injury.  He is wearing a thumb spica brace at all times at work.  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  He is here for strengthening.  He is working with a lift restriction of no more than 5 lbs.     PROBLEM LIST (Impacting functional limitations):  1. Decreased Strength   2. Increased Pain  3. Decreased Activity Tolerance INTERVENTIONS PLANNED: (Treatment may consist of any combination of the following)  4. Manual therapy training  5. Modalities  6. Therapeutic activity  7. Therapeutic exercise       GOALS: (Goals have been discussed and agreed upon with patient.)  Short-Term Functional Goals: Time Frame: in 2 weeks  1. Pt will increase left hand key pinch strength to 22 lbs  Goal met  2. Pt will increase left hand two point pinch strength to 14 lbs  Goal met  3. Pt will increase left hand three point pinch strength to 16 lbs  Goal met  Discharge Goals: Time Frame: in 4 weeks     1.   Pt will increase left hand key pinch strength to 24 lbs  Not met:  23 lbs (06/12/2019)  2.   Pt will increase left hand two point pinch strength to 16 lbs  Not met  14 lbs (06/12/2019)  3.   Pt will increase left hand three point pinch strength to 20 lbs  Not met  16 lbs (06/12/2019)  4.   Pt will write at work with 0-2/10 fatigue and pain  Goal met    OUTCOME MEASURE:     Tool Used: Disabilities of the Arm, Shoulder and Hand (DASH) Questionnaire - Quick Version  Score:  Initial: 34/55  Most Recent: 17/55 (Date: 06/12/2019 )   Interpretation of Score: The DASH is designed to measure the activities of daily  living in person's with upper extremity dysfunction or pain.  Each section is scored on a 1-5 scale, 5 representing the greatest disability.  The scores of each section are added together for a total score of 55.    Score 11 12-19 20-28 29-37 38-45 46-54 55   Modifier CH CI CJ CK CL CM CN     ? Carrying, Moving, and Handling Objects:    [phone] - CURRENT STATUS: CK - 40%-59% impaired, limited or restricted   [id number] - GOAL STATUS: CJ - 20%-39% impaired, limited or restricted   [id number] - D/C STATUS:  CI - 1%-19% impaired, limited or restricted        MEDICAL NECESSITY:   ?? Patient is expected to demonstrate progress in strength to improve safety during work.  REASON FOR SERVICES/OTHER COMMENTS:   ?? Patient continues to require skilled intervention due to ongoing left dominant hand weakness following work injury..  Total Duration:  OT Patient Time In/Time Out  Time In: 1000  Time Out: 1100

## 2019-06-12 NOTE — Progress Notes (Signed)
 Progress Notes by Gerre Sari PARAS, OT at 06/12/19 1020                Author: Gerre Sari PARAS, OT  Service: Occupational Therapy  Author Type: Occupational Therapist       Filed: 06/12/19 1025  Date of Service: 06/12/19 1020  Status: Signed          Editor: Gerre Sari PARAS, OT (Occupational Therapist)                  Omar JONETTA Pa   DOB: 1985/12/18   Primary: [name]   Secondary:   [facility]   [address] [place], [place] [address]   Phone:[phone]   [phone]                  OUTPATIENT OCCUPATIONAL THERAPY: Daily Treatment Note 06/12/2019   Visit Count:  8            Pre-treatment Symptoms/Complaints:  Reports 0-2 pain at work; improved strength.  In agreement with discharge  today      Pain:  Initial: Pain Intensity 1: 1/10   Post Session:  0/10        Medications Last Reviewed:  06/12/2019      Updated Objective Findings:  See discharge summary     TREATMENT:           Therapeutic Activity:  (   ):       Therapeutic Exercise:  ( 50 minutes ):  Graded clothespins, theraputty isolated and composite resistive  hand movements, L UE in fluidotherapy and completing tendon glides, wrist AROM and thumb flexion, extension, palmar and radial abduction, thumb rotation in dry heat modality      Manual Therapy: (   ):       Ultrasound: (10 minutes):  Small head, 3.3 mHz, 0.7 W/cm2 x 9 minutes at thumb CMC  and MP joints      [facility] Portal   Treatment/Session Summary:       Response to Treatment:  Pt has achieved maximum benefits from therapy.  He is independent with HEP and has increased thumb strength.   Pt discharged from therapy today.     Communication/Consultation:  None today     Equipment provided today: None today     Recommendations/Intent for next treatment session:  Discharge today      Total Treatment Billable Duration:  60 minutes   OT Patient Time In/Time Out   Time In: 1000   Time Out: 1100   Wendy J Garland, OTR/L, CLT      No  future appointments.

## 2019-06-12 NOTE — Discharge Instructions (Signed)
Therapy Discharge by Aaron Edelman, OT at 06/12/19 1000                Author: Aaron Edelman, OT  Service: Occupational Therapy  Author Type: Occupational Therapist       Filed: 06/12/19 1052  Date of Service: 06/12/19 1000  Status: Signed          Editor: Aaron Edelman, OT (Occupational Therapist)                  Omar Mclaughlin   DOB: [birth year]   Primary: [facility]   Secondary:   [place] [facility]   [address] [place] [address]   Phone:[phone]   [phone]                      OUTPATIENT OCCUPATIONAL THERAPY:Initial Assessment 06/12/2019         ICD-10: Treatment Diagnosis: M62.81, Muscle weakness, geralized   S60.012A, Contusion of left thumb without damage to nail      Precautions/Allergies:    Patient has no known allergies.    TREATMENT PLAN:   Effective Dates: 05/13/2019 TO 07/13/2019 (60 days).  Frequency/Duration : 2 times a week for 60 Day(s)     MEDICAL/REFERRING DIAGNOSIS:   Contusion of left thumb without damage to nail, initial encounter [S60.012A]     DATE OF ONSET: August 17,2020   REFERRING PHYSICIAN: Marton Redwood, MD   MD Orders: Evaluate and treat   Return MD Appointment: May 21, 2019       DISCHARGE SUMMARY 06/12/2019:  Omar Mclaughlin completed 12 occupational therapy treatment  sessions.  He has improved his thumb strength and reduced pain with resistive activity.  He is functioning at full capacity at work, applying pacing and joint protection techniques. DASH Outcome Measurement score decreased from 50% impairment or restriction  to 10% impairment or restriction.  Maximum benefits achieved in therapy.  Pt is in agreement with discharge today.      INITIAL ASSESSMENT:  Omar Mclaughlin  presents as left dominant, left thumb injury February 24, 2019.  He reports that a heavy bar fell on his thumb, which was lodged between the bar an a bolt of cloth.  He was diagnosed with a thumb contusion but reports he was told he had a  hairline fracture,  possibly of the first metacarpal.  He is 11 weeks status post injury.  He is wearing a thumb spica brace at all times at work.  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  He is here for strengthening.  He is working with a lift restriction  of no more than 5 lbs.          PROBLEM LIST (Impacting functional limitations):   1.  Decreased Strength   2.  Increased Pain   3.  Decreased Activity Tolerance  INTERVENTIONS PLANNED: (Treatment may consist of any combination of the following)   4.  Manual therapy training   5.  Modalities   6.  Therapeutic activity   7.  Therapeutic exercise             GOALS: (Goals have been discussed and agreed upon with patient.)   Short-Term Functional Goals: Time Frame: in 2 weeks   1.  Pt will increase left hand key pinch strength to 22 lbs  Goal met   2.  Pt will increase left hand two  point pinch strength to 14 lbs  Goal met   3.  Pt will increase left hand three point pinch strength to 16 lbs  Goal met   Discharge Goals: Time Frame: in 4 weeks      1.   Pt will increase left hand key pinch strength to 24 lbs  Not met:  23 lbs (06/12/2019)   2.   Pt will increase left hand two point pinch strength to 16 lbs  Not met  14 lbs (06/12/2019)   3.   Pt will increase left hand three point pinch strength to 20 lbs  Not met  16 lbs (06/12/2019)   4.   Pt will write at work with 0-2/10 fatigue and pain  Goal met      OUTCOME MEASURE:       Tool Used: Disabilities of the Arm, Shoulder and Hand (DASH)  Questionnaire - Quick Version   Score:   Initial: 34/55    Most Recent: 17/55 (Date: 06/12/2019 )        Interpretation of Score: The DASH is designed to measure the activities  of daily living in person's with upper extremity dysfunction or pain.  Each section is scored on a 1-5 scale, 5 representing the greatest disability.  The scores of each section are added together for a total score of 55.     Score  11  12-19  20-28  29-37  38-45  46-54  55      Modifier  CH   CI  CJ  CK  CL  CM  CN             Carrying, Moving, and Handling Objects:      [id number] - CURRENT STATUS: CK - 40%-59% impaired, limited or restricted    O1308 - GOAL STATUS: CJ - 20%-39% impaired, limited or restricted    M5784 - D/C STATUS:  CI - 1%-19% impaired, limited or restricted            MEDICAL NECESSITY:      Patient is expected to demonstrate progress in strength to improve safety during work.   REASON FOR SERVICES/OTHER COMMENTS:     Patient continues to require skilled intervention due to ongoing left dominant hand weakness following work injury..   Total Duration:   OT Patient Time In/Time Out   Time In: 1000   Time Out: 1100
